# Patient Record
Sex: Female | Born: 1980 | Race: White | Hispanic: No | Marital: Married | Smoking: Former smoker
Health system: Southern US, Community
[De-identification: ages and names within clinical notes are randomized; demographics above are authoritative.]

## PROBLEM LIST (undated history)

## (undated) DIAGNOSIS — R51 Headache: Secondary | ICD-10-CM

## (undated) DIAGNOSIS — R519 Headache, unspecified: Secondary | ICD-10-CM

## (undated) DIAGNOSIS — K219 Gastro-esophageal reflux disease without esophagitis: Secondary | ICD-10-CM

## (undated) DIAGNOSIS — I10 Essential (primary) hypertension: Secondary | ICD-10-CM

## (undated) HISTORY — PX: COLONOSCOPY: SHX174

## (undated) HISTORY — PX: CYST EXCISION: SHX5701

## (undated) HISTORY — PX: CHOLECYSTECTOMY: SHX55

---

## 2004-10-06 ENCOUNTER — Emergency Department: Payer: Self-pay | Admitting: Emergency Medicine

## 2007-09-30 ENCOUNTER — Emergency Department: Payer: Self-pay

## 2008-12-22 ENCOUNTER — Emergency Department: Payer: Self-pay | Admitting: Internal Medicine

## 2010-12-26 ENCOUNTER — Emergency Department: Payer: Self-pay | Admitting: Emergency Medicine

## 2013-07-21 ENCOUNTER — Emergency Department: Payer: Self-pay | Admitting: Emergency Medicine

## 2013-12-29 ENCOUNTER — Emergency Department: Payer: Self-pay | Admitting: Emergency Medicine

## 2015-01-06 ENCOUNTER — Emergency Department: Payer: Self-pay | Admitting: Emergency Medicine

## 2015-05-04 DIAGNOSIS — N978 Female infertility of other origin: Secondary | ICD-10-CM | POA: Insufficient documentation

## 2015-05-04 DIAGNOSIS — Z3181 Encounter for male factor infertility in female patient: Secondary | ICD-10-CM

## 2015-07-27 ENCOUNTER — Encounter: Payer: Self-pay | Admitting: Emergency Medicine

## 2015-07-27 ENCOUNTER — Emergency Department
Admission: EM | Admit: 2015-07-27 | Discharge: 2015-07-27 | Disposition: A | Discharge status: Home / Self Care | Payer: BLUE CROSS/BLUE SHIELD | Attending: Emergency Medicine | Admitting: Emergency Medicine

## 2015-07-27 ENCOUNTER — Emergency Department: Payer: BLUE CROSS/BLUE SHIELD

## 2015-07-27 DIAGNOSIS — M25561 Pain in right knee: Secondary | ICD-10-CM | POA: Diagnosis not present

## 2015-07-27 DIAGNOSIS — Z720 Tobacco use: Secondary | ICD-10-CM | POA: Diagnosis not present

## 2015-07-27 DIAGNOSIS — Z79899 Other long term (current) drug therapy: Secondary | ICD-10-CM | POA: Diagnosis not present

## 2015-07-27 NOTE — ED Notes (Signed)
Reports pain behind right knee.  Ambulates well, minimal swelling noted

## 2015-07-27 NOTE — Discharge Instructions (Signed)
Please seek medical attention for any high fevers, chest pain, shortness of breath, change in behavior, persistent vomiting, bloody stool or any other new or concerning symptoms.  RICE: Routine Care for Injuries The routine care of many injuries includes Rest, Ice, Compression, and Elevation (RICE). HOME CARE INSTRUCTIONS  Rest is needed to allow your body to heal. Routine activities can usually be resumed when comfortable. Injured tendons and bones can take up to 6 weeks to heal. Tendons are the cord-like structures that attach muscle to bone.  Ice following an injury helps keep the swelling down and reduces pain.  Put ice in a plastic bag.  Place a towel between your skin and the bag.  Leave the ice on for 15-20 minutes, 3-4 times a day, or as directed by your health care provider. Do this while awake, for the first 24 to 48 hours. After that, continue as directed by your caregiver.  Compression helps keep swelling down. It also gives support and helps with discomfort. If an elastic bandage has been applied, it should be removed and reapplied every 3 to 4 hours. It should not be applied tightly, but firmly enough to keep swelling down. Watch fingers or toes for swelling, bluish discoloration, coldness, numbness, or excessive pain. If any of these problems occur, remove the bandage and reapply loosely. Contact your caregiver if these problems continue.  Elevation helps reduce swelling and decreases pain. With extremities, such as the arms, hands, legs, and feet, the injured area should be placed near or above the level of the heart, if possible. SEEK IMMEDIATE MEDICAL CARE IF:  You have persistent pain and swelling.  You develop redness, numbness, or unexpected weakness.  Your symptoms are getting worse rather than improving after several days. These symptoms may indicate that further evaluation or further X-rays are needed. Sometimes, X-rays may not show a small broken bone (fracture)  until 1 week or 10 days later. Make a follow-up appointment with your caregiver. Ask when your X-ray results will be ready. Make sure you get your X-ray results. Document Released: 03/04/2001 Document Revised: 11/25/2013 Document Reviewed: 04/21/2011 [facility] Patient Information 2015 [place], [place]. This information is not intended to replace advice given to you by your health care provider. Make sure you discuss any questions you have with your health care provider.

## 2015-07-27 NOTE — ED Provider Notes (Signed)
[facility] Emergency Department Provider Note   ____________________________________________  Time seen: 1825  I have reviewed the triage vital signs and the nursing notes.   HISTORY  Chief Complaint Knee Pain   History limited by: Not Limited   HPI Patricia Gonzalez is a 34 y.o. female who presents to the emergency department today from urgent care because of concerns for right knee pain. The patient states that the knee pain has been present for roughly 1.5 weeks. She denies any obvious trauma, twisting or injury to the knee. She states the pain is located in the back of the knee towards the calf muscle. She states that it is painful at night and it is hard for her to fully extend her right knee. At urgent care they measure were concern for increased diameter of that right knee. Patient denies any fevers.     History reviewed. No pertinent past medical history.  There are no active problems to display for this patient.   History reviewed. No pertinent past surgical history.  Current Outpatient Rx  Name  Route  Sig  Dispense  Refill  . labetalol (NORMODYNE) 300 MG tablet   Oral   Take 300 mg by mouth 2 (two) times daily.         . metFORMIN (GLUCOPHAGE) 500 MG tablet   Oral   Take 500 mg by mouth 2 (two) times daily with a meal.         . omeprazole (PRILOSEC) 40 MG capsule   Oral   Take 40 mg by mouth daily.           Allergies Review of patient's allergies indicates no known allergies.  History reviewed. No pertinent family history.  Social History Social History  Substance Use Topics  . Smoking status: Current Every Day Smoker  . Smokeless tobacco: None  . Alcohol Use: No    Review of Systems  Constitutional: Negative for fever. Cardiovascular: Negative for chest pain. Respiratory: Negative for shortness of breath. Gastrointestinal: Negative for abdominal pain, vomiting and diarrhea. Musculoskeletal: Positive for  right knee pain Skin: Negative for rash. Neurological: Negative for headaches, focal weakness or numbness.   10-point ROS otherwise negative.  ____________________________________________   PHYSICAL EXAM:  VITAL SIGNS: ED Triage Vitals  Enc Vitals Group     BP 07/27/15 1657 148/85 mmHg     Pulse Rate 07/27/15 1657 78     Resp 07/27/15 1657 18     Temp 07/27/15 1657 98 F (36.7 C)     Temp Source 07/27/15 1657 Oral     SpO2 07/27/15 1657 99 %     Weight 07/27/15 1657 225 lb (102.059 kg)     Height 07/27/15 1657 5\' 5"  (1.651 m)     Head Cir --      Peak Flow --      Pain Score 07/27/15 1701 5   Constitutional: Alert and oriented. Well appearing and in no distress. Eyes: Conjunctivae are normal. PERRL. Normal extraocular movements. ENT   Head: Normocephalic and atraumatic.   Nose: No congestion/rhinnorhea.   Mouth/Throat: Mucous membranes are moist.   Neck: No stridor. Hematological/Lymphatic/Immunilogical: No cervical lymphadenopathy. Cardiovascular: Normal rate, regular rhythm.  No murmurs, rubs, or gallops. Respiratory: Normal respiratory effort without tachypnea nor retractions. Breath sounds are clear and equal bilaterally. No wheezes/rales/rhonchi. Musculoskeletal: Slightly tender to palpation of the right posterior knee or. Slightly tender to full passive extension of the right knee. No skin discoloration appreciated. No deformity  appreciated. No knee effusion appreciated. Neurovascularly intact distally. Neurologic:  Normal speech and language. No gross focal neurologic deficits are appreciated. Speech is normal.  Skin:  Skin is warm, dry and intact. No rash noted. Psychiatric: Mood and affect are normal. Speech and behavior are normal. Patient exhibits appropriate insight and judgment.  ____________________________________________    LABS (pertinent  positives/negatives)  None  ____________________________________________   EKG  None  ____________________________________________    RADIOLOGY   US venous Lower unilateral right IMPRESSION: No evidence of deep venous thrombosis.  ____________________________________________   PROCEDURES  Procedure(s) performed: None  Critical Care performed: No  ____________________________________________   INITIAL IMPRESSION / ASSESSMENT AND PLAN / ED COURSE  Pertinent labs & imaging results that were available during my care of the patient were reviewed by me and considered in my medical decision making (see chart for details).  Patient presented with right knee pain for roughly 1.5 weeks. Pain is located in back of the knee. On exam no obvious deformities, effusions, discoloration. Ultrasound did not show any signs of blood clots. At this point doubt osseous injury given no traumatic injury to the knee. Recommended RICE  ____________________________________________   FINAL CLINICAL IMPRESSION(S) / ED DIAGNOSES  Final diagnoses:  Knee pain, right     Nance Pear, MD 07/27/15 0370

## 2016-04-06 ENCOUNTER — Emergency Department: Payer: 59

## 2016-04-06 ENCOUNTER — Emergency Department
Admission: EM | Admit: 2016-04-06 | Discharge: 2016-04-06 | Disposition: A | Discharge status: Home / Self Care | Payer: 59 | Attending: Emergency Medicine | Admitting: Emergency Medicine

## 2016-04-06 ENCOUNTER — Encounter: Payer: Self-pay | Admitting: Emergency Medicine

## 2016-04-06 DIAGNOSIS — Y9389 Activity, other specified: Secondary | ICD-10-CM | POA: Insufficient documentation

## 2016-04-06 DIAGNOSIS — Y929 Unspecified place or not applicable: Secondary | ICD-10-CM | POA: Diagnosis not present

## 2016-04-06 DIAGNOSIS — S99911A Unspecified injury of right ankle, initial encounter: Secondary | ICD-10-CM | POA: Diagnosis present

## 2016-04-06 DIAGNOSIS — Z7984 Long term (current) use of oral hypoglycemic drugs: Secondary | ICD-10-CM | POA: Insufficient documentation

## 2016-04-06 DIAGNOSIS — Y999 Unspecified external cause status: Secondary | ICD-10-CM | POA: Insufficient documentation

## 2016-04-06 DIAGNOSIS — S96911A Strain of unspecified muscle and tendon at ankle and foot level, right foot, initial encounter: Secondary | ICD-10-CM

## 2016-04-06 DIAGNOSIS — S96211A Strain of intrinsic muscle and tendon at ankle and foot level, right foot, initial encounter: Secondary | ICD-10-CM | POA: Insufficient documentation

## 2016-04-06 DIAGNOSIS — Z79899 Other long term (current) drug therapy: Secondary | ICD-10-CM | POA: Insufficient documentation

## 2016-04-06 DIAGNOSIS — F17200 Nicotine dependence, unspecified, uncomplicated: Secondary | ICD-10-CM | POA: Diagnosis not present

## 2016-04-06 DIAGNOSIS — X501XXA Overexertion from prolonged static or awkward postures, initial encounter: Secondary | ICD-10-CM | POA: Insufficient documentation

## 2016-04-06 MED ORDER — HYDROCODONE-ACETAMINOPHEN 5-325 MG PO TABS
2.0000 | ORAL_TABLET | ORAL | Status: AC
  Administered 2016-04-06: 2 via ORAL
  Filled 2016-04-06: qty 2

## 2016-04-06 MED ORDER — HYDROCODONE-ACETAMINOPHEN 5-325 MG PO TABS: 1.0000 | ORAL_TABLET | Freq: Four times a day (QID) | ORAL | Status: DC | PRN

## 2016-04-06 NOTE — Discharge Instructions (Signed)
As we discussed please return to the ER right away if you develop any weakness in the foot, can't walk on the foot, your pain is worsening, you develop any redness, fever, chills, a cold or blue foot or other new concerns arise.  No driving today or while using hydrocodone.  Ankle Sprain An ankle sprain is an injury to the strong, fibrous tissues (ligaments) that hold the bones of your ankle joint together.  CAUSES An ankle sprain is usually caused by a fall or by twisting your ankle. Ankle sprains most commonly occur when you step on the outer edge of your foot, and your ankle turns inward. People who participate in sports are more prone to these types of injuries.  SYMPTOMS   Pain in your ankle. The pain may be present at rest or only when you are trying to stand or walk.  Swelling.  Bruising. Bruising may develop immediately or within 1 to 2 days after your injury.  Difficulty standing or walking, particularly when turning corners or changing directions. DIAGNOSIS  Your caregiver will ask you details about your injury and perform a physical exam of your ankle to determine if you have an ankle sprain. During the physical exam, your caregiver will press on and apply pressure to specific areas of your foot and ankle. Your caregiver will try to move your ankle in certain ways. An X-ray exam may be done to be sure a bone was not broken or a ligament did not separate from one of the bones in your ankle (avulsion fracture).  TREATMENT  Certain types of braces can help stabilize your ankle. Your caregiver can make a recommendation for this. Your caregiver may recommend the use of medicine for pain. If your sprain is severe, your caregiver may refer you to a surgeon who helps to restore function to parts of your skeletal system (orthopedist) or a physical therapist. [place] ice to your injury for 1-2 days or as directed by your caregiver. Applying ice helps to reduce  inflammation and pain.  Put ice in a plastic bag.  Place a towel between your skin and the bag.  Leave the ice on for 15-20 minutes at a time, every 2 hours while you are awake.  Only take over-the-counter or prescription medicines for pain, discomfort, or fever as directed by your caregiver.  Elevate your injured ankle above the level of your heart as much as possible for 2-3 days.  If your caregiver recommends crutches, use them as instructed. Gradually put weight on the affected ankle. Continue to use crutches or a cane until you can walk without feeling pain in your ankle.  If you have a plaster splint, wear the splint as directed by your caregiver. Do not rest it on anything harder than a pillow for the first 24 hours. Do not put weight on it. Do not get it wet. You may take it off to take a shower or bath.  You may have been given an elastic bandage to wear around your ankle to provide support. If the elastic bandage is too tight (you have numbness or tingling in your foot or your foot becomes cold and blue), adjust the bandage to make it comfortable.  If you have an air splint, you may blow more air into it or let air out to make it more comfortable. You may take your splint off at night and before taking a shower or bath. Wiggle your toes in the splint several  times per day to decrease swelling. SEEK MEDICAL CARE IF:   You have rapidly increasing bruising or swelling.  Your toes feel extremely cold or you lose feeling in your foot.  Your pain is not relieved with medicine. SEEK IMMEDIATE MEDICAL CARE IF:  Your toes are numb or blue.  You have severe pain that is increasing. MAKE SURE YOU:   Understand these instructions.  Will watch your condition.  Will get help right away if you are not doing well or get worse.   This information is not intended to replace advice given to you by your health care provider. Make sure you discuss any questions you have with your health  care provider.   Document Released: 11/20/2005 Document Revised: 12/11/2014 Document Reviewed: 12/02/2011 Elsevier Interactive Patient Education [employer].

## 2016-04-06 NOTE — ED Provider Notes (Signed)
[facility] Emergency Department Provider Note  ____________________________________________  Time seen: Approximately 9:01 PM  I have reviewed the triage vital signs and the nursing notes.   HISTORY  Chief Complaint Ankle Pain and Leg Swelling    HPI Patricia Gonzalez is a 35 y.o. female presents for evaluation of right ankle pain and swelling. Patient reports that for the last few days she's been noticing some tenderness around her right ankle, that she has a bad right ankle and occasionally infrequently has sprained it.  She works on her feet all day, reports she last few days that she's had a lot of discomfort around the right ankle, trouble standing and walking. No fevers redness or chills. She does note however that the right ankle seems slightly swollen. Some pain shoots up into the back of the right calf when she tries to walk.  No numbness or tingling or weakness, but states that pain standing on the ankle causes pain. No pain in the ankle or leg. No pain in the foot. She is not aware of any clear injury, but reports that she feels like sometimes she can strain her ankle very easily and standing out at work seems to make things worse similar to this.  History reviewed. No pertinent past medical history.  There are no active problems to display for this patient.   Past Surgical History  Procedure Laterality Date  . Cholecystectomy      Current Outpatient Rx  Name  Route  Sig  Dispense  Refill  . HYDROcodone-acetaminophen (NORCO/VICODIN) 5-325 MG tablet   Oral   Take 1-2 tablets by mouth every 6 (six) hours as needed for moderate pain.   20 tablet   0   . labetalol (NORMODYNE) 300 MG tablet   Oral   Take 300 mg by mouth 2 (two) times daily.         . metFORMIN (GLUCOPHAGE) 500 MG tablet   Oral   Take 500 mg by mouth 2 (two) times daily with a meal.         . omeprazole (PRILOSEC) 40 MG capsule   Oral   Take 40 mg by mouth daily.            Allergies Review of patient's allergies indicates no known allergies.  No family history on file.  Social History Social History  Substance Use Topics  . Smoking status: Current Every Day Smoker  . Smokeless tobacco: None  . Alcohol Use: No    Review of Systems Constitutional: No fever/chills Eyes: No visual changes. ENT: No sore throat. Cardiovascular: Denies chest pain. Respiratory: Denies shortness of breath. Gastrointestinal: No abdominal pain.  No nausea, no vomiting.  No diarrhea.  No constipation. Genitourinary: Negative for dysuria. Musculoskeletal: Negative for back pain. Skin: Negative for rash. Neurological: Negative for headaches, focal weakness or numbness.  10-point ROS otherwise negative.  ____________________________________________   PHYSICAL EXAM:  VITAL SIGNS: ED Triage Vitals  Enc Vitals Group     BP 04/06/16 2035 178/97 mmHg     Pulse Rate 04/06/16 2035 67     Resp 04/06/16 2035 20     Temp 04/06/16 2035 97.8 F (36.6 C)     Temp Source 04/06/16 2035 Oral     SpO2 04/06/16 2035 100 %     Weight 04/06/16 2035 220 lb (99.791 kg)     Height 04/06/16 2035 5\' 5"  (1.651 m)     Head Cir --      Peak  Flow --      Pain Score 04/06/16 2034 8     Pain Loc --      Pain Edu? --      Excl. in [place]? --    Constitutional: Alert and oriented. Well appearing and in no acute distress. Eyes: Conjunctivae are normal. PERRL. EOMI. Head: Atraumatic. Nose: No congestion/rhinnorhea. Mouth/Throat: Mucous membranes are moist.  Oropharynx non-erythematous. Neck: No stridor. Cardiovascular: Normal rate, regular rhythm. Grossly normal heart sounds.  Good peripheral circulation. Respiratory: Normal respiratory effort.  No retractions. Lungs CTAB. Gastrointestinal: Soft and nontender. No distention. Musculoskeletal:   Lower Extremities  No edema. Normal DP/PT pulses bilateral with good cap refill.  Normal neuro-motor function lower extremities  bilateral.  RIGHT Right lower extremity demonstrates normal strength, good use of all muscles. No edema bruising or contusions of the right hip, right knee. Full range of motion of the right lower extremity without pain except for some moderate tenderness to range of motion of the right ankle. There is noted tenderness across the right lateral ankle without frank effusion. There is minimal edema extending from the ankle across the anterior tibial fibular ligament. No pain on axial loading. No evidence of trauma or deformity. Palpable posterior tibial and dorsalis pedis pulses with normal capillary refill to the foot. There is no overlying erythema or frank joint effusion.  LEFT Left lower extremity demonstrates normal strength, good use of all muscles. No edema bruising or contusions of the hip,  knee, ankle. Full range of motion of the left lower extremity without pain. No pain on axial loading. No evidence of trauma.   Neurologic:  Normal speech and language. No gross focal neurologic deficits are appreciated. No gait instability. Skin:  Skin is warm, dry and intact. No rash noted. Psychiatric: Mood and affect are normal. Speech and behavior are normal.  ____________________________________________   LABS (all labs ordered are listed, but only abnormal results are displayed)  Labs Reviewed - No data to display ____________________________________________  EKG   ____________________________________________  RADIOLOGY   US Venous Img Lower Unilateral Right (Final result) Result time: 04/06/16 21:47:45   Final result by Rad Results In Interface (04/06/16 21:47:45)   Narrative:   CLINICAL DATA: Right leg pain for 3 days.  EXAM: Right LOWER EXTREMITY VENOUS DOPPLER ULTRASOUND  TECHNIQUE: Gray-scale sonography with graded compression, as well as color Doppler and duplex ultrasound were performed to evaluate the lower extremity deep venous systems from the level of the common  femoral vein and including the common femoral, femoral, profunda femoral, popliteal and calf veins including the posterior tibial, peroneal and gastrocnemius veins when visible. The superficial great saphenous vein was also interrogated. Spectral Doppler was utilized to evaluate flow at rest and with distal augmentation maneuvers in the common femoral, femoral and popliteal veins.  COMPARISON: None.  FINDINGS: Contralateral Common Femoral Vein: Respiratory phasicity is normal and symmetric with the symptomatic side. No evidence of thrombus. Normal compressibility.  Common Femoral Vein: No evidence of thrombus. Normal compressibility, respiratory phasicity and response to augmentation.  Saphenofemoral Junction: No evidence of thrombus. Normal compressibility and flow on color Doppler imaging.  Profunda Femoral Vein: No evidence of thrombus. Normal compressibility and flow on color Doppler imaging.  Femoral Vein: No evidence of thrombus. Normal compressibility, respiratory phasicity and response to augmentation.  Popliteal Vein: No evidence of thrombus. Normal compressibility, respiratory phasicity and response to augmentation.  Calf Veins: No evidence of thrombus. Normal compressibility and flow on color Doppler imaging.  Superficial Great  Saphenous Vein: No evidence of thrombus. Normal compressibility and flow on color Doppler imaging.  Venous Reflux: None.  Other Findings: None.  IMPRESSION: No evidence of deep venous thrombosis.   Electronically Signed By: Lucienne Capers M.D. On: 04/06/2016 21:47          DG Ankle Complete Right (Final result) Result time: 04/06/16 21:18:05   Final result by Rad Results In Interface (04/06/16 21:18:05)   Narrative:   CLINICAL DATA: Right lower leg edema.  EXAM: RIGHT ANKLE - COMPLETE 3+ VIEW  COMPARISON: None.  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 Small plantar calcaneal  spur. No evidence of acute fracture or subluxation. No focal bone lesion or bone destruction. Bone cortex and trabecular architecture appear intact. No radiopaque soft tissue foreign bodies.  IMPRESSION: No acute bony abnormalities.   Electronically Signed By: Lucienne Capers M.D. On: 04/06/2016 21:18    ____________________________________________   PROCEDURES  Procedure(s) performed: None  Critical Care performed: No  ____________________________________________   INITIAL IMPRESSION / ASSESSMENT AND PLAN / ED COURSE  Pertinent labs & imaging results that were available during my care of the patient were reviewed by me and considered in my medical decision making (see chart for details).  History evaluation of right ankle pain. No evidence of acute neurologic or vascular deficit. Given pain and slight edema, ultrasound was performed which does not demonstrate DVT. No clear evidence of trauma, however x-ray does not demonstrate injury. She has no fever, no redness or erythema to suggest acute inflammatory or infectious process. No significant risk factors for septic joint.  Discussed with the patient, after receiving hydrocodone she feels improved. Patient offered cane/crutches, however she states that she asked he has a walker and cane at home which she's had these before. She will use this. Did discuss with the patient, and she states that her sister will be picking her up and giving her a ride home as we are giving her pain medication here. Patient is agreeable to this.  I will prescribe the patient a narcotic pain medicine due to their condition which I anticipate will cause at least moderate pain short term. I discussed with the patient safe use of narcotic pain medicines, and that they are not to drive, work in dangerous areas, or ever take more than prescribed (no more than 1 pill every 6 hours). We discussed that this is the type of medication that can be  overdosed on  and the risks of this type of medicine. Patient is very agreeable to only use as prescribed and to never use more than prescribed.  Return precautions and treatment recommendations and follow-up discussed with the patient who is agreeable with the plan.  I discussed with the patient specifically if she develops a fever, increasing pain or swelling, cool, numb, or weakness, redness, chills, or any signs of infection that she should return to ER right away. ____________________________________________   FINAL CLINICAL IMPRESSION(S) / ED DIAGNOSES  Final diagnoses:  Right ankle strain, initial encounter      Delman Kitten, MD 04/06/16 2236

## 2016-04-06 NOTE — ED Notes (Signed)
Pt states her sister will be available to take her home, or she will go upstairs with her husband in room 17 if he is admitted.

## 2016-04-06 NOTE — ED Notes (Addendum)
Patient ambulatory to triage with steady gait, without difficulty or distress noted; pt reports pain began to right ankle on Monday with no known injury, "shooting up back of leg" and some swelling noted--pt indicates calf and behind knee; denies [facility] or CP

## 2016-05-09 ENCOUNTER — Other Ambulatory Visit: Payer: Self-pay | Admitting: Specialist

## 2016-05-09 DIAGNOSIS — I10 Essential (primary) hypertension: Secondary | ICD-10-CM | POA: Insufficient documentation

## 2016-05-09 DIAGNOSIS — E119 Type 2 diabetes mellitus without complications: Secondary | ICD-10-CM | POA: Insufficient documentation

## 2016-05-11 ENCOUNTER — Ambulatory Visit
Admission: RE | Admit: 2016-05-11 | Discharge: 2016-05-11 | Disposition: A | Discharge status: Home / Self Care | Payer: 59 | Source: Ambulatory Visit | Attending: Specialist | Admitting: Specialist

## 2016-07-11 DIAGNOSIS — G4733 Obstructive sleep apnea (adult) (pediatric): Secondary | ICD-10-CM | POA: Insufficient documentation

## 2016-12-26 ENCOUNTER — Encounter
Admission: RE | Admit: 2016-12-26 | Discharge: 2016-12-26 | Disposition: A | Discharge status: Home / Self Care | Payer: 59 | Source: Ambulatory Visit | Attending: Specialist | Admitting: Specialist

## 2016-12-26 DIAGNOSIS — Z01812 Encounter for preprocedural laboratory examination: Secondary | ICD-10-CM | POA: Insufficient documentation

## 2016-12-26 HISTORY — DX: Headache: R51

## 2016-12-26 HISTORY — DX: Headache, unspecified: R51.9

## 2016-12-26 HISTORY — DX: Essential (primary) hypertension: I10

## 2016-12-26 HISTORY — DX: Gastro-esophageal reflux disease without esophagitis: K21.9

## 2016-12-26 LAB — TYPE AND SCREEN
ABO/RH(D): O POS
Antibody Screen: NEGATIVE

## 2016-12-26 LAB — BASIC METABOLIC PANEL
Anion gap: 10 (ref 5–15)
BUN: 16 mg/dL (ref 6–20)
CO2: 22 mmol/L (ref 22–32)
Calcium: 9.2 mg/dL (ref 8.9–10.3)
Chloride: 104 mmol/L (ref 101–111)
Creatinine, Ser: 0.56 mg/dL (ref 0.44–1.00)
GFR calc Af Amer: >60 mL/min (ref >60)
GFR calc non Af Amer: >60 mL/min (ref >60)
GLUCOSE: 90 mg/dL (ref 65–99)
POTASSIUM: 3.6 mmol/L (ref 3.5–5.1)
Sodium: 136 mmol/L (ref 135–145)

## 2016-12-26 NOTE — Patient Instructions (Signed)
  Your procedure is scheduled on: 01-02-17 (TUESDAY) Report to Same Day Surgery 2nd floor medical mall [facility] Entrance-take elevator on left to 2nd floor.  Check in with surgery information desk.) To find out your arrival time please call [phone] between 1PM - 3PM on 01-01-17 [facility])  Remember: Instructions that are not followed completely may result in serious medical risk, up to and including death, or upon the discretion of your surgeon and anesthesiologist your surgery may need to be rescheduled.    _x___ 1. Do not eat food or drink liquids after midnight. No gum chewing or hard candies.     __x__ 2. No Alcohol for 24 hours before or after surgery.   __x__3. No Smoking for 24 prior to surgery.   ____  4. Bring all medications with you on the day of surgery if instructed.    __x__ 5. Notify your doctor if there is any change in your medical condition     (cold, fever, infections).     Do not wear jewelry, make-up, hairpins, clips or nail polish.  Do not wear lotions, powders, or perfumes. You may wear deodorant.  Do not shave 48 hours prior to surgery. Men may shave face and neck.  Do not bring valuables to the hospital.    [facility] is not responsible for any belongings or valuables.               Contacts, dentures or bridgework may not be worn into surgery.  Leave your suitcase in the car. After surgery it may be brought to your room.  For patients admitted to the hospital, discharge time is determined by your treatment team.   Patients discharged the day of surgery will not be allowed to drive home.  You will need someone to drive you home and stay with you the night of your procedure.    Please read over the following fact sheets that you were given:   [facility] Preparing for Surgery and or MRSA Information   _x___ Take these medicines the morning of surgery with A SIP OF WATER:    1. ATENOLOL  2. PROPRANOLOL  3. VERAPAMIL  4.  5.  6.  ____Fleets  enema or Magnesium Citrate as directed.   _x___ Use CHG Soap or sage wipes as directed on instruction sheet   ____ Use inhalers on the day of surgery and bring to hospital day of surgery  _X___ Stop metformin 2 days prior to surgery-LAST DOSE ON Saturday, January 27TH    ____ Take 1/2 of usual insulin dose the night before surgery and none on the morning of surgery.   ____ Stop Aspirin, Coumadin, Pllavix ,Eliquis, Effient, or Pradaxa  x__ Stop Anti-inflammatories such as Advil, Aleve, Ibuprofen, Motrin, Naproxen,          Naprosyn, Goodies powders or aspirin products NOW-Ok to take Tylenol.   ____ Stop supplements until after surgery.    ____ Bring C-Pap to the hospital.

## 2016-12-27 ENCOUNTER — Inpatient Hospital Stay: Admission: RE | Admit: 2016-12-27 | Payer: 59 | Source: Ambulatory Visit

## 2017-01-02 ENCOUNTER — Encounter
Admission: RE | Disposition: A | Discharge status: Home / Self Care | Payer: Self-pay | Source: Ambulatory Visit | Attending: Specialist

## 2017-01-02 ENCOUNTER — Inpatient Hospital Stay: Payer: 59 | Admitting: Certified Registered Nurse Anesthetist

## 2017-01-02 ENCOUNTER — Inpatient Hospital Stay
Admission: RE | Admit: 2017-01-02 | Discharge: 2017-01-03 | DRG: 621 | Disposition: A | Discharge status: Home / Self Care | Payer: 59 | Source: Ambulatory Visit | Attending: Specialist | Admitting: Specialist

## 2017-01-02 DIAGNOSIS — F17210 Nicotine dependence, cigarettes, uncomplicated: Secondary | ICD-10-CM | POA: Diagnosis present

## 2017-01-02 DIAGNOSIS — Z888 Allergy status to other drugs, medicaments and biological substances status: Secondary | ICD-10-CM

## 2017-01-02 DIAGNOSIS — I10 Essential (primary) hypertension: Secondary | ICD-10-CM | POA: Diagnosis present

## 2017-01-02 DIAGNOSIS — G4733 Obstructive sleep apnea (adult) (pediatric): Secondary | ICD-10-CM | POA: Diagnosis present

## 2017-01-02 DIAGNOSIS — Z885 Allergy status to narcotic agent status: Secondary | ICD-10-CM | POA: Diagnosis not present

## 2017-01-02 DIAGNOSIS — E119 Type 2 diabetes mellitus without complications: Secondary | ICD-10-CM | POA: Diagnosis present

## 2017-01-02 DIAGNOSIS — Z7984 Long term (current) use of oral hypoglycemic drugs: Secondary | ICD-10-CM

## 2017-01-02 DIAGNOSIS — Z79899 Other long term (current) drug therapy: Secondary | ICD-10-CM | POA: Diagnosis not present

## 2017-01-02 DIAGNOSIS — K449 Diaphragmatic hernia without obstruction or gangrene: Secondary | ICD-10-CM | POA: Diagnosis present

## 2017-01-02 DIAGNOSIS — Z8249 Family history of ischemic heart disease and other diseases of the circulatory system: Secondary | ICD-10-CM

## 2017-01-02 DIAGNOSIS — Z833 Family history of diabetes mellitus: Secondary | ICD-10-CM

## 2017-01-02 DIAGNOSIS — Z6839 Body mass index (BMI) 39.0-39.9, adult: Secondary | ICD-10-CM

## 2017-01-02 HISTORY — PX: LAPAROSCOPIC GASTRIC SLEEVE RESECTION WITH HIATAL HERNIA REPAIR: SHX6512

## 2017-01-02 LAB — GLUCOSE, CAPILLARY
Glucose-Capillary: 119 mg/dL — ABNORMAL HIGH (ref 65–99)
Glucose-Capillary: 155 mg/dL — ABNORMAL HIGH (ref 65–99)

## 2017-01-02 LAB — HEMOGLOBIN AND HEMATOCRIT, BLOOD
HCT: 36 % (ref 35.0–47.0)
HEMOGLOBIN: 12.6 g/dL (ref 12.0–16.0)

## 2017-01-02 LAB — POCT PREGNANCY, URINE: PREG TEST UR: NEGATIVE

## 2017-01-02 LAB — ABO/RH: ABO/RH(D): O POS

## 2017-01-02 SURGERY — GASTRECTOMY, SLEEVE, LAPAROSCOPIC, WITH HIATAL HERNIA REPAIR
Anesthesia: General | Wound class: Clean Contaminated

## 2017-01-02 MED ORDER — FENTANYL CITRATE (PF) 250 MCG/5ML IJ SOLN
INTRAMUSCULAR | Status: AC
  Filled 2017-01-02: qty 5

## 2017-01-02 MED ORDER — HYDROMORPHONE HCL 1 MG/ML IJ SOLN
2.0000 mg | INTRAMUSCULAR | Status: DC | PRN
  Administered 2017-01-02 – 2017-01-03 (×6): 2 mg via INTRAVENOUS
  Filled 2017-01-02 (×6): qty 2
  Filled 2017-01-02: qty 1

## 2017-01-02 MED ORDER — FENTANYL CITRATE (PF) 100 MCG/2ML IJ SOLN
INTRAMUSCULAR | Status: AC
  Filled 2017-01-02: qty 2

## 2017-01-02 MED ORDER — SUCCINYLCHOLINE CHLORIDE 20 MG/ML IJ SOLN
INTRAMUSCULAR | Status: DC | PRN
  Administered 2017-01-02: 120 mg via INTRAVENOUS

## 2017-01-02 MED ORDER — EPHEDRINE SULFATE 50 MG/ML IJ SOLN
INTRAMUSCULAR | Status: DC | PRN
  Administered 2017-01-02: 10 mg via INTRAVENOUS

## 2017-01-02 MED ORDER — PROMETHAZINE HCL 25 MG/ML IJ SOLN: 6.2500 mg | INTRAMUSCULAR | Status: DC | PRN

## 2017-01-02 MED ORDER — BUPIVACAINE HCL (PF) 0.5 % IJ SOLN
INTRAMUSCULAR | Status: AC
  Filled 2017-01-02: qty 30

## 2017-01-02 MED ORDER — CEFOXITIN SODIUM-DEXTROSE 2-2.2 GM-% IV SOLR (PREMIX)
2.0000 g | Freq: Once | INTRAVENOUS | Status: AC
  Administered 2017-01-02: 2000 mg via INTRAVENOUS

## 2017-01-02 MED ORDER — FENTANYL CITRATE (PF) 100 MCG/2ML IJ SOLN
50.0000 ug | Freq: Once | INTRAMUSCULAR | Status: AC
  Administered 2017-01-02: 50 ug via INTRAVENOUS

## 2017-01-02 MED ORDER — ONDANSETRON HCL 4 MG/2ML IJ SOLN
4.0000 mg | INTRAMUSCULAR | Status: DC | PRN
Start: 2017-01-02 — End: 2017-01-03
  Administered 2017-01-02: 4 mg via INTRAVENOUS
  Filled 2017-01-02: qty 2

## 2017-01-02 MED ORDER — PROPOFOL 10 MG/ML IV BOLUS
INTRAVENOUS | Status: AC
Start: 2017-01-02 — End: 2017-01-02
  Filled 2017-01-02: qty 20

## 2017-01-02 MED ORDER — PROMETHAZINE HCL 25 MG/ML IJ SOLN: 12.5000 mg | INTRAMUSCULAR | Status: DC | PRN

## 2017-01-02 MED ORDER — HYDROCODONE-ACETAMINOPHEN 7.5-325 MG/15ML PO SOLN
15.0000 mL | Freq: Four times a day (QID) | ORAL | 0 refills | Status: AC | PRN
Start: 2017-01-02 — End: 2017-01-09

## 2017-01-02 MED ORDER — HYDROMORPHONE HCL 1 MG/ML IJ SOLN
0.5000 mg | INTRAMUSCULAR | Status: AC | PRN
  Administered 2017-01-02 (×4): 0.5 mg via INTRAVENOUS

## 2017-01-02 MED ORDER — MIDAZOLAM HCL 2 MG/2ML IJ SOLN
INTRAMUSCULAR | Status: AC
  Filled 2017-01-02: qty 2

## 2017-01-02 MED ORDER — DEXMEDETOMIDINE HCL IN NACL 200 MCG/50ML IV SOLN
INTRAVENOUS | Status: DC | PRN
  Administered 2017-01-02: 8 ug via INTRAVENOUS

## 2017-01-02 MED ORDER — ROCURONIUM 10MG/ML (10ML) SYRINGE FOR MEDFUSION PUMP - OPTIME: INTRAVENOUS | Status: DC | PRN

## 2017-01-02 MED ORDER — PROPOFOL 10 MG/ML IV BOLUS
INTRAVENOUS | Status: DC | PRN
  Administered 2017-01-02: 200 ug via INTRAVENOUS

## 2017-01-02 MED ORDER — PANTOPRAZOLE SODIUM 40 MG IV SOLR
40.0000 mg | Freq: Every day | INTRAVENOUS | Status: DC
  Administered 2017-01-02: 40 mg via INTRAVENOUS
  Filled 2017-01-02: qty 40

## 2017-01-02 MED ORDER — ONDANSETRON 4 MG PO TBDP: 4.0000 mg | ORAL_TABLET | ORAL | 0 refills | Status: DC | PRN

## 2017-01-02 MED ORDER — OXYCODONE HCL 5 MG/5ML PO SOLN
5.0000 mg | ORAL | Status: DC | PRN
  Administered 2017-01-03 (×3): 10 mg via ORAL
  Filled 2017-01-02 (×3): qty 10

## 2017-01-02 MED ORDER — ACETAMINOPHEN 10 MG/ML IV SOLN
1000.0000 mg | Freq: Once | INTRAVENOUS | Status: AC
  Administered 2017-01-02: 1000 mg via INTRAVENOUS

## 2017-01-02 MED ORDER — PHENYLEPHRINE HCL 10 MG/ML IJ SOLN
INTRAMUSCULAR | Status: DC | PRN
  Administered 2017-01-02: 200 ug via INTRAVENOUS
  Administered 2017-01-02: 100 ug via INTRAVENOUS
  Administered 2017-01-02: 200 ug via INTRAVENOUS
  Administered 2017-01-02: 100 ug via INTRAVENOUS

## 2017-01-02 MED ORDER — ATENOLOL 25 MG PO TABS: 100.0000 mg | ORAL_TABLET | ORAL | Status: DC

## 2017-01-02 MED ORDER — SCOPOLAMINE 1 MG/3DAYS TD PT72
MEDICATED_PATCH | TRANSDERMAL | Status: AC
  Administered 2017-01-02: 1.5 mg via TRANSDERMAL
  Filled 2017-01-02: qty 1

## 2017-01-02 MED ORDER — CEFOXITIN SODIUM-DEXTROSE 2-2.2 GM-% IV SOLR (PREMIX)
INTRAVENOUS | Status: AC
  Administered 2017-01-02: 2000 mg via INTRAVENOUS
  Filled 2017-01-02: qty 50

## 2017-01-02 MED ORDER — LABETALOL HCL 100 MG PO TABS: 300.0000 mg | ORAL_TABLET | Freq: Two times a day (BID) | ORAL | Status: DC

## 2017-01-02 MED ORDER — FENTANYL CITRATE (PF) 250 MCG/5ML IJ SOLN
INTRAMUSCULAR | Status: DC | PRN
  Administered 2017-01-02: 50 ug via INTRAVENOUS
  Administered 2017-01-02: 150 ug via INTRAVENOUS
  Administered 2017-01-02: 50 ug via INTRAVENOUS

## 2017-01-02 MED ORDER — HYDROMORPHONE BOLUS VIA INFUSION: 2.0000 mg | INTRAVENOUS | Status: DC | PRN

## 2017-01-02 MED ORDER — ENOXAPARIN SODIUM 40 MG/0.4ML ~~LOC~~ SOLN: 40.0000 mg | SUBCUTANEOUS | 0 refills | Status: DC

## 2017-01-02 MED ORDER — ACETAMINOPHEN 10 MG/ML IV SOLN
INTRAVENOUS | Status: AC
  Filled 2017-01-02: qty 100

## 2017-01-02 MED ORDER — ACETAMINOPHEN 160 MG/5ML PO SOLN
325.0000 mg | ORAL | Status: DC | PRN
  Filled 2017-01-02: qty 20.3

## 2017-01-02 MED ORDER — ROCURONIUM BROMIDE 100 MG/10ML IV SOLN
INTRAVENOUS | Status: DC | PRN
  Administered 2017-01-02: 10 mg via INTRAVENOUS
  Administered 2017-01-02: 30 mg via INTRAVENOUS
  Administered 2017-01-02: 20 mg via INTRAVENOUS

## 2017-01-02 MED ORDER — SCOPOLAMINE 1 MG/3DAYS TD PT72
1.0000 | MEDICATED_PATCH | Freq: Once | TRANSDERMAL | Status: DC
  Administered 2017-01-02: 1.5 mg via TRANSDERMAL

## 2017-01-02 MED ORDER — HYDROMORPHONE HCL 1 MG/ML IJ SOLN
INTRAMUSCULAR | Status: AC
  Filled 2017-01-02: qty 1

## 2017-01-02 MED ORDER — SUGAMMADEX SODIUM 200 MG/2ML IV SOLN
INTRAVENOUS | Status: DC | PRN
  Administered 2017-01-02: 200 mg via INTRAVENOUS

## 2017-01-02 MED ORDER — FENTANYL CITRATE (PF) 100 MCG/2ML IJ SOLN
25.0000 ug | INTRAMUSCULAR | Status: DC | PRN
  Administered 2017-01-02 (×3): 50 ug via INTRAVENOUS

## 2017-01-02 MED ORDER — ROCURONIUM BROMIDE 50 MG/5ML IV SOSY
PREFILLED_SYRINGE | INTRAVENOUS | Status: AC
  Filled 2017-01-02: qty 5

## 2017-01-02 MED ORDER — PROPRANOLOL HCL ER 60 MG PO CP24
60.0000 mg | ORAL_CAPSULE | ORAL | Status: DC
  Filled 2017-01-02: qty 1

## 2017-01-02 MED ORDER — ENOXAPARIN SODIUM 40 MG/0.4ML ~~LOC~~ SOLN
40.0000 mg | Freq: Two times a day (BID) | SUBCUTANEOUS | Status: DC
  Administered 2017-01-03: 40 mg via SUBCUTANEOUS
  Filled 2017-01-02: qty 0.4

## 2017-01-02 MED ORDER — VERAPAMIL HCL ER 180 MG PO CP24: 180.0000 mg | ORAL_CAPSULE | ORAL | Status: DC

## 2017-01-02 MED ORDER — BUPIVACAINE-EPINEPHRINE (PF) 0.5% -1:200000 IJ SOLN
INTRAMUSCULAR | Status: DC | PRN
  Administered 2017-01-02: 30 mL

## 2017-01-02 MED ORDER — EPINEPHRINE PF 1 MG/ML IJ SOLN
INTRAMUSCULAR | Status: AC
Start: 2017-01-02 — End: 2017-01-02
  Filled 2017-01-02: qty 1

## 2017-01-02 MED ORDER — MIDAZOLAM HCL 2 MG/2ML IJ SOLN
INTRAMUSCULAR | Status: DC | PRN
  Administered 2017-01-02: 2 mg via INTRAVENOUS

## 2017-01-02 MED ORDER — SODIUM CHLORIDE 0.9 % IV SOLN
INTRAVENOUS | Status: DC
  Administered 2017-01-02: 1000 mL via INTRAVENOUS
  Administered 2017-01-02 (×2): via INTRAVENOUS

## 2017-01-02 MED ORDER — ONDANSETRON HCL 4 MG/2ML IJ SOLN
INTRAMUSCULAR | Status: DC | PRN
  Administered 2017-01-02: 4 mg via INTRAVENOUS

## 2017-01-02 MED ORDER — DEXAMETHASONE SODIUM PHOSPHATE 10 MG/ML IJ SOLN
INTRAMUSCULAR | Status: DC | PRN
  Administered 2017-01-02: 8 mg via INTRAVENOUS

## 2017-01-02 MED ORDER — ACETAMINOPHEN 160 MG/5ML PO SOLN
650.0000 mg | ORAL | Status: DC | PRN
  Filled 2017-01-02: qty 20.3

## 2017-01-02 MED ORDER — DEXTROSE 5 % IV SOLN
2.0000 g | Freq: Three times a day (TID) | INTRAVENOUS | Status: AC
  Administered 2017-01-02 (×2): 2 g via INTRAVENOUS
  Filled 2017-01-02 (×2): qty 2

## 2017-01-02 SURGICAL SUPPLY — 49 items
APPLIER CLIP ROT 13.4 12 LRG (CLIP)
BANDAGE ELASTIC 6 LF NS (GAUZE/BANDAGES/DRESSINGS) ×8 IMPLANT
BLADE SURG SZ11 CARB STEEL (BLADE) ×4 IMPLANT
CANISTER SUCT 1200ML W/VALVE (MISCELLANEOUS) ×4 IMPLANT
CHLORAPREP W/TINT 26ML (MISCELLANEOUS) ×8 IMPLANT
CLIP APPLIE ROT 13.4 12 LRG (CLIP) IMPLANT
DECANTER SPIKE VIAL GLASS SM (MISCELLANEOUS) ×4 IMPLANT
DEFOGGER SCOPE WARMER CLEARIFY (MISCELLANEOUS) ×4 IMPLANT
DERMABOND ADVANCED (GAUZE/BANDAGES/DRESSINGS) ×2
DERMABOND ADVANCED .7 DNX12 (GAUZE/BANDAGES/DRESSINGS) ×2 IMPLANT
DRAPE UTILITY 15X26 TOWEL STRL (DRAPES) ×8 IMPLANT
FILTER LAP SMOKE EVAC STRL (MISCELLANEOUS) ×4 IMPLANT
GLOVE BIO SURGEON STRL SZ8 (GLOVE) ×20 IMPLANT
GOWN STRL REUS W/ TWL LRG LVL3 (GOWN DISPOSABLE) ×4 IMPLANT
GOWN STRL REUS W/ TWL XL LVL3 (GOWN DISPOSABLE) ×2 IMPLANT
GOWN STRL REUS W/TWL LRG LVL3 (GOWN DISPOSABLE) ×4
GOWN STRL REUS W/TWL XL LVL3 (GOWN DISPOSABLE) ×2
GRASPER SUT TROCAR 14GX15 (MISCELLANEOUS) ×4 IMPLANT
IRRIGATION STRYKERFLOW (MISCELLANEOUS) IMPLANT
IRRIGATOR STRYKERFLOW (MISCELLANEOUS)
IV NS 1000ML (IV SOLUTION) ×2
IV NS 1000ML BAXH (IV SOLUTION) ×2 IMPLANT
KIT RM TURNOVER STRD PROC AR (KITS) ×4 IMPLANT
LABEL OR SOLS (LABEL) ×4 IMPLANT
NDL INSUFF 14G 150MM VS150000 (NEEDLE) ×4 IMPLANT
NDL SAFETY 22GX1.5 (NEEDLE) ×4 IMPLANT
NS IRRIG 500ML POUR BTL (IV SOLUTION) ×4 IMPLANT
PACK LAP CHOLECYSTECTOMY (MISCELLANEOUS) ×4 IMPLANT
RELOAD GREEN (STAPLE) ×4 IMPLANT
RELOAD STAPLER GOLD 60MM (STAPLE) ×10 IMPLANT
SET SUCTION IRRIG HYDROSURG (IRRIGATION / IRRIGATOR) ×4 IMPLANT
SHEARS HARMONIC ACE PLUS 45CM (MISCELLANEOUS) ×4 IMPLANT
SLEEVE ENDOPATH XCEL 5M (ENDOMECHANICALS) ×8 IMPLANT
SLEEVE GASTRECTOMY 36FR VISIGI (MISCELLANEOUS) ×4 IMPLANT
STAPLER ECHELON BIOABSB 60 FLE (MISCELLANEOUS) ×20 IMPLANT
STAPLER ECHELON LONG 60 440 (INSTRUMENTS) ×4 IMPLANT
STAPLER RELOAD GOLD 60MM (STAPLE) ×20
SUT DEVICE BRAIDED 0X39 (SUTURE) ×4 IMPLANT
SUT VIC AB 0 CT2 27 (SUTURE) ×4 IMPLANT
SUT VIC AB 3-0 SH 27 (SUTURE) ×2
SUT VIC AB 3-0 SH 27X BRD (SUTURE) ×2 IMPLANT
SUT VIC AB 4-0 PS2 18 (SUTURE) ×8 IMPLANT
TROCAR BLADELESS 15MM (ENDOMECHANICALS) ×4 IMPLANT
TROCAR SL VERSASTEP 5M LG  B (MISCELLANEOUS) ×2
TROCAR SL VERSASTEP 5M LG B (MISCELLANEOUS) ×2 IMPLANT
TROCAR XCEL 12X100 BLDLESS (ENDOMECHANICALS) ×4 IMPLANT
TROCAR XCEL NON-BLD 5MMX100MML (ENDOMECHANICALS) ×4 IMPLANT
TUBING INSUFFLATOR HEATED (MISCELLANEOUS) ×4 IMPLANT
WATER STERILE IRR 1000ML POUR (IV SOLUTION) ×4 IMPLANT

## 2017-01-02 NOTE — Anesthesia Preprocedure Evaluation (Signed)
Anesthesia Evaluation  Patient identified by MRN, date of birth, ID band Patient awake    Reviewed: Allergy & Precautions, H&P , NPO status , Patient's Chart, lab work & pertinent test results, reviewed documented beta blocker date and time   History of Anesthesia Complications Negative for: history of anesthetic complications  Airway Mallampati: I  TM Distance: >3 FB Neck ROM: full    Dental  (+) Caps, Teeth Intact, Dental Advidsory Given   Pulmonary neg shortness of breath, sleep apnea and Continuous Positive Airway Pressure Ventilation , neg COPD, neg recent URI, former smoker,           Cardiovascular Exercise Tolerance: Good hypertension, (-) angina(-) CAD, (-) Past MI, (-) Cardiac Stents and (-) CABG (-) dysrhythmias (-) Valvular Problems/Murmurs     Neuro/Psych negative neurological ROS  negative psych ROS   GI/Hepatic Neg liver ROS, GERD  Medicated,  Endo/Other  diabetesMorbid obesity  Renal/GU negative Renal ROS  negative genitourinary   Musculoskeletal   Abdominal   Peds  Hematology negative hematology ROS (+)   Anesthesia Other Findings Past Medical History: No date: Diabetes mellitus without complication (HCC) No date: GERD (gastroesophageal reflux disease) No date: Headache No date: Hypertension No date: Sleep apnea     Comment: uses CPAP   Reproductive/Obstetrics negative OB ROS                             Anesthesia Physical Anesthesia Plan  ASA: III  Anesthesia Plan: General   Post-op Pain Management:    Induction:   Airway Management Planned:   Additional Equipment:   Intra-op Plan:   Post-operative Plan:   Informed Consent: I have reviewed the patients History and Physical, chart, labs and discussed the procedure including the risks, benefits and alternatives for the proposed anesthesia with the patient or authorized representative who has indicated  his/her understanding and acceptance.   Dental Advisory Given  Plan Discussed with: Anesthesiologist, CRNA and Surgeon  Anesthesia Plan Comments:         Anesthesia Quick Evaluation

## 2017-01-02 NOTE — Anesthesia Procedure Notes (Signed)
Procedure Name: Intubation Date/Time: 01/02/2017 8:10 AM Performed by: Rockne Coons Pre-anesthesia Checklist: Patient identified, Emergency Drugs available, Suction available, Patient being monitored and Timeout performed Patient Re-evaluated:Patient Re-evaluated prior to inductionOxygen Delivery Method: Circle system utilized Preoxygenation: Pre-oxygenation with 100% oxygen Intubation Type: IV induction Laryngoscope Size: Mac and 3 Grade View: Grade I Tube type: Oral Tube size: 7.0 mm Number of attempts: 1 Airway Equipment and Method: Stylet Placement Confirmation: ETT inserted through vocal cords under direct vision,  positive ETCO2 and breath sounds checked- equal and bilateral Secured at: 24 cm Tube secured with: Tape Dental Injury: Injury to lip  Comments: Small cut to left upper lip

## 2017-01-02 NOTE — Transfer of Care (Signed)
Immediate Anesthesia Transfer of Care Note  Patient: Patricia Gonzalez  Procedure(s) Performed: Procedure(s): LAPAROSCOPIC GASTRIC SLEEVE RESECTION WITH HIATAL HERNIA REPAIR  Patient Location: PACU  Anesthesia Type:General  Level of Consciousness: awake, alert  and oriented  Airway & Oxygen Therapy: Patient Spontanous Breathing and Patient connected to nasal cannula oxygen  Post-op Assessment: Report given to RN, Post -op Vital signs reviewed and stable, Post -op Vital signs reviewed and unstable, Anesthesiologist notified, Patient moving all extremities and Patient moving all extremities X 4  Post vital signs: Reviewed and stable  Last Vitals:  Vitals:   01/02/17 0614  BP: 136/77  Pulse: 76  Resp: 14  Temp: 36.5 C    Last Pain:  Vitals:   01/02/17 0614  TempSrc: Tympanic         Complications: No apparent anesthesia complications

## 2017-01-02 NOTE — Op Note (Signed)
   Pre-op dx: morbid obesity, HH Post-op dx: same Procedure: lap sleeve gastrectomy and hiatal hernia repair Surgeon: Darnell Level AsstLorenda Cahill EBL: none Anesth: GETA Specimen: portion of stomach Bougie: 43 Fr Distance from pylorus: 5 cm  Details of procedure: The patient was taken to the operating room and placed on the operating table in the supine position. Timeout was performed. SCD's were placed. The patient was placed under general anesthesia without incident. Broad spectrum antibiotics were given. A 36 french bougie was placed.  The abdomen was prepped and draped in the usual fashion. It was accessed using a 5 mm optical trocar in the left upper quadrant. Pneumoperitoneum was established without incident. Multiple other trocars were placed in preparation for the procedure. A liver retractor was placed.   The hiatus was explored. No significant hiatal hernia was present. The greater curve was mobilized 5 cm from the pylorus using the harmonic scalpel. This continued dividing the short gastrics and mobilizing the fundus off the left hemidiaphragm. The posterior lesser sac adhesions were divided as well. The Visi-G was advanced to the antrum and the antrum was bisected with an Echelon 60 mm green load stapler. All staple loads had buttressing material. The remainder of the sleeve was created by firing gold load staplers through the left-sided 12 mm port parallel to the lesser curve leaving a small tail at the angle of His. No bleeding or leakage was present at the staple line.\\  The hiatus was explored and a hiatal hernia was evident. Posterior mobilization of the crurae was performed lengthening the intra-abdominal esophagus and the aperture was closed to a reasonable defect using interrupted 0 Surgidac sutures.  The divided gastric tissue was removed without spillage and sent to pathology. The trocars and liver retractor were removed without incident.the 15 mm port site fascia was reapproximated  using an interrupted 0 Vicryl suture using a needle passer. The wounds were closed with 4-0 Vicryl and dermabond.The patient arrived at the recovery room in stable condition.

## 2017-01-02 NOTE — Anesthesia Postprocedure Evaluation (Signed)
Anesthesia Post Note  Patient: SHENETTA MOOK  Procedure(s) Performed: Procedure(s): LAPAROSCOPIC GASTRIC SLEEVE RESECTION WITH HIATAL HERNIA REPAIR  Patient location during evaluation: PACU Anesthesia Type: General Level of consciousness: awake and alert Pain management: pain level controlled Vital Signs Assessment: post-procedure vital signs reviewed and stable Respiratory status: spontaneous breathing, nonlabored ventilation, respiratory function stable and patient connected to nasal cannula oxygen Cardiovascular status: blood pressure returned to baseline and stable Postop Assessment: no signs of nausea or vomiting Anesthetic complications: no     Last Vitals:  Vitals:   01/02/17 1039 01/02/17 1042  BP:  120/64  Pulse: 74 (!) 58  Resp: 18 16  Temp:  36.2 C    Last Pain:  Vitals:   01/02/17 1042  TempSrc:   PainSc: 8                  Martha Clan

## 2017-01-02 NOTE — Progress Notes (Addendum)
Nutrition Note  INTERVENTION:  RD consulted for nutrition education regarding inpatient bariatric surgery.   RD provided "The Liquid Diet" handout from the Bariatric Surgery Guide from the [facility] [place]. This handout previously provided to patient prior to surgery is a duplicate copy. Discussed what foods/liquids are consistent with a Clear Liquid Diet and reinforced Key Concepts such as no carbonation, no caffeine, or sugar containing beverages. Provided methods to prevent dehydration and promote protein intake, using clock and sample fluid schedule. RD encouraged follow-up with outpatient dietitian after discharge.  Teach back method used.  Expect good compliance.  NUTRITION DIAGNOSIS:  Food and nutrition knowledge related deficit related to recent bariatric surgery as evidenced by dietitian consult for nutrition education   GOAL:  Patient will be able to sip and tolerate CL within 24-48 hours  MONITOR:  Energy intake Digestive system  ASSESSMENT:  36 year old female s/p laparoscopic sleeve gastrectomy and hiatal hernia repair on 1/30.   Spoke with patient at bedside. Patient was sipping on apple juice through straw. RD brought patient clear 2 oz cups as guide for sipping beverages and removed straw from bedside. Patient reports her abdomen is sore. She reports she has tolerated some ice chips and sips of apple juice. She forgot to bring her Isopure from home. Patient reports she will ask her sister to bring it in for her. Patient has Bariatric Advantage and Premier Protein supplements at home for when she begins full liquid diet. Patient confirms she has appropriate B-complex supplement for after discharge. She has her follow-up appointment scheduled on 2/13. No further questions at this time.   Body mass index is 39.44 kg/m. Pt meets criteria for Obesity Class II based on current BMI.  Labs and medications reviewed.   Willey Blade, MS, RD, LDN Pager:  [phone] After Hours Pager: [phone]

## 2017-01-02 NOTE — Anesthesia Post-op Follow-up Note (Cosign Needed)
Anesthesia QCDR form completed.        

## 2017-01-02 NOTE — H&P (Signed)
See faxed H and P no changes

## 2017-01-02 NOTE — Plan of Care (Signed)
Problem: Food- and Nutrition-Related Knowledge Deficit (NB-1.1) Goal: Nutrition education Formal process to instruct or train a patient/client in a skill or to impart knowledge to help patients/clients voluntarily manage or modify food choices and eating behavior to maintain or improve health. Outcome: Completed/Met Date Met: 01/02/17 RD consulted for nutrition education regarding inpatient bariatric surgery.   RD provided "The Liquid Diet" handout from the Bariatric Surgery Guide from the [facility] [place]. This handout previously provided to patient prior to surgery is a duplicate copy. Discussed what foods/liquids are consistent with a Clear Liquid Diet and reinforced Key Concepts such as no carbonation, no caffeine, or sugar containing beverages. Provided methods to prevent dehydration and promote protein intake, using clock and sample fluid schedule. RD encouraged follow-up with outpatient dietitian after discharge.  Teach back method used.  Expect good compliance.

## 2017-01-03 ENCOUNTER — Encounter: Payer: Self-pay | Admitting: Specialist

## 2017-01-03 LAB — CBC WITH DIFFERENTIAL/PLATELET
BASOS PCT: 0 %
Basophils Absolute: 0 10*3/uL (ref 0–0.1)
EOS ABS: 0 10*3/uL (ref 0–0.7)
Eosinophils Relative: 0 %
HCT: 35.8 % (ref 35.0–47.0)
HEMOGLOBIN: 12.9 g/dL (ref 12.0–16.0)
Lymphocytes Relative: 24 %
Lymphs Abs: 2.1 10*3/uL (ref 1.0–3.6)
MCH: 33.7 pg (ref 26.0–34.0)
MCHC: 36 g/dL (ref 32.0–36.0)
MCV: 93.6 fL (ref 80.0–100.0)
Monocytes Absolute: 0.8 10*3/uL (ref 0.2–0.9)
Monocytes Relative: 8 %
NEUTROS PCT: 68 %
Neutro Abs: 6.1 10*3/uL (ref 1.4–6.5)
Platelets: 210 10*3/uL (ref 150–440)
RBC: 3.83 MIL/uL (ref 3.80–5.20)
RDW: 13.3 % (ref 11.5–14.5)
WBC: 9.1 10*3/uL (ref 3.6–11.0)

## 2017-01-03 LAB — CREATININE, SERUM: CREATININE: 0.62 mg/dL (ref 0.44–1.00)

## 2017-01-03 LAB — SURGICAL PATHOLOGY

## 2017-01-03 NOTE — Progress Notes (Signed)
IV was removed. Discharge instructions, follow-up appointments, and prescriptions were provided to the pt. All questions answered. The pt was taken downstairs via wheelchair by volunteer services.

## 2017-01-03 NOTE — Discharge Summary (Signed)
Physician Discharge Summary  Patient ID: Patricia Gonzalez MRN: [id number] DOB/AGE: Sep 05, 1981 36 y.o.  Admit date: 01/02/2017 Discharge date: 01/03/2017  Admission Diagnoses: Morbid Obesity; Hypertension  Discharge Diagnoses:  Active Problems:   Morbid obesity ([place])   Discharged Condition: good  Hospital Course: Pt had an uneventful elective sleeve gastrectomy for morbid obesity. She tolerated this well and readily able to achieve discharge criteria by pod1.   Consults: None  Significant Diagnostic Studies: labs: see chart  Treatments: IV hydration and surgery: laparoscopic sleeve gastrectomy; Hiatal Hernia  Discharge Exam: Blood pressure (!) 134/51, pulse (!) 57, temperature 98.4 F (36.9 C), resp. rate 20, height 5\' 5"  (1.651 m), weight 107.5 kg (237 lb), SpO2 98 %.   Disposition: 01-Home or Self Care  Discharge Instructions    Call MD for:  difficulty breathing, headache or visual disturbances    Complete by:  As directed    Call MD for:  extreme fatigue    Complete by:  As directed    Call MD for:  hives    Complete by:  As directed    Call MD for:  persistant dizziness or light-headedness    Complete by:  As directed    Call MD for:  persistant nausea and vomiting    Complete by:  As directed    Call MD for:  redness, tenderness, or signs of infection (pain, swelling, redness, odor or green/yellow discharge around incision site)    Complete by:  As directed    Call MD for:  severe uncontrolled pain    Complete by:  As directed    Call MD for:  temperature >100.4    Complete by:  As directed    Discharge instructions    Complete by:  As directed    Remember to start your chewable or liquid B complex once you arrive home and take this daily for 30 days. You may continue to take this beyond 30 days if you choose. Stick with a Clear liquid diet for 2 days post operatively then you may advance to a Full Liquid diet for 12 days, which means you will be on a strict  liquid diet for 14 days. Your daily goal is going to be to get in 60-80g of protein and 64oz of fluid.   Driving Restrictions    Complete by:  As directed    You may not drive until 24 hours past your last dose of pain medications.   Increase activity slowly    Complete by:  As directed    Lifting restrictions    Complete by:  As directed    Do not lift more than 10-15 pounds for 4-6 weeks post operatively   Other Restrictions    Complete by:  As directed    Avoid using your core muscles for 30 days after surgery - motions like pushing, pulling, climbing etc. Make sure to get up and walk frequently every day to help avoid developing blood clots.     Allergies as of 01/03/2017      Reactions   Ketorolac Tromethamine Anaphylaxis   Shots   Morphine Other (See Comments)   Caused stomach pain      Medication List    STOP taking these medications   atenolol 100 MG tablet Commonly known as:  TENORMIN   labetalol 300 MG tablet Commonly known as:  NORMODYNE   lisinopril-hydrochlorothiazide 20-25 MG tablet Commonly known as:  PRINZIDE,ZESTORETIC   metFORMIN 500 MG 24 hr tablet Commonly  known as:  GLUCOPHAGE-XR   verapamil 180 MG 24 hr capsule Commonly known as:  VERELAN PM     TAKE these medications   butalbital-acetaminophen-caffeine 50-325-40 MG tablet Commonly known as:  FIORICET, ESGIC Take 1-2 tablets by mouth every 4 (four) hours as needed for headache.   cyclobenzaprine 10 MG tablet Commonly known as:  FLEXERIL Take 10 mg by mouth every 8 (eight) hours as needed for muscle spasms.   desogestrel-ethinyl estradiol 0.15-30 MG-MCG tablet Commonly known as:  APRI,EMOQUETTE,SOLIA Take 1 tablet by mouth daily.   DEXILANT 60 MG capsule Generic drug:  dexlansoprazole Take 60 mg by mouth daily.   enoxaparin 40 MG/0.4ML injection Commonly known as:  LOVENOX Inject 0.4 mLs (40 mg total) into the skin daily.   HYDROcodone-acetaminophen 7.5-325 mg/15 ml solution Commonly  known as:  HYCET Take 15 mLs by mouth 4 (four) times daily as needed for moderate pain.   levocetirizine 5 MG tablet Commonly known as:  XYZAL Take 5 mg by mouth every morning.   ondansetron 4 MG disintegrating tablet Commonly known as:  ZOFRAN ODT Take 1 tablet (4 mg total) by mouth every 4 (four) hours as needed for nausea or vomiting.   propranolol 60 MG tablet Commonly known as:  INDERAL Take 60 mg by mouth every morning.            Durable Medical Equipment        Start     Ordered   01/02/17 1120  For home use only DME continuous positive airway pressure (CPAP)  Once    Comments:  Pt has own CPAP. Use her equipment and supplies.  Question Answer Comment  Patient has OSA or probable OSA Yes   Is the patient currently using CPAP in the home Yes   Settings Other see comments   CPAP supplies needed Mask, headgear, cushions, filters, heated tubing and water chamber      01/02/17 1119       Signed: Mardelle Matte 01/03/2017, 1:38 PM

## 2017-01-03 NOTE — Progress Notes (Signed)
Pr refused to walk, stating that she is waiting on her sister to come up here and walk with her.

## 2017-01-08 ENCOUNTER — Other Ambulatory Visit: Payer: Self-pay | Admitting: Neurology

## 2017-01-08 DIAGNOSIS — G43719 Chronic migraine without aura, intractable, without status migrainosus: Secondary | ICD-10-CM

## 2017-01-11 ENCOUNTER — Ambulatory Visit
Admission: RE | Admit: 2017-01-11 | Discharge: 2017-01-11 | Disposition: A | Discharge status: Home / Self Care | Payer: 59 | Source: Ambulatory Visit | Attending: Neurology | Admitting: Neurology

## 2017-01-11 DIAGNOSIS — G43719 Chronic migraine without aura, intractable, without status migrainosus: Secondary | ICD-10-CM | POA: Diagnosis not present

## 2017-01-11 MED ORDER — GADOBENATE DIMEGLUMINE 529 MG/ML IV SOLN
20.0000 mL | Freq: Once | INTRAVENOUS | Status: AC | PRN
  Administered 2017-01-11: 20 mL via INTRAVENOUS

## 2017-01-12 ENCOUNTER — Ambulatory Visit: Payer: 59

## 2017-01-18 ENCOUNTER — Ambulatory Visit: Payer: 59

## 2017-01-18 DIAGNOSIS — G43719 Chronic migraine without aura, intractable, without status migrainosus: Secondary | ICD-10-CM | POA: Insufficient documentation

## 2017-05-05 ENCOUNTER — Encounter: Payer: Self-pay | Admitting: *Deleted

## 2017-05-05 ENCOUNTER — Ambulatory Visit
Admission: EM | Admit: 2017-05-05 | Discharge: 2017-05-05 | Disposition: A | Discharge status: Home / Self Care | Payer: 59 | Attending: Emergency Medicine | Admitting: Emergency Medicine

## 2017-05-05 DIAGNOSIS — L550 Sunburn of first degree: Secondary | ICD-10-CM

## 2017-05-05 DIAGNOSIS — R220 Localized swelling, mass and lump, head: Secondary | ICD-10-CM | POA: Diagnosis not present

## 2017-05-05 DIAGNOSIS — L551 Sunburn of second degree: Secondary | ICD-10-CM | POA: Diagnosis not present

## 2017-05-05 MED ORDER — SILVER SULFADIAZINE 1 % EX CREA: 1.0000 "application " | TOPICAL_CREAM | Freq: Every day | CUTANEOUS | 0 refills | Status: DC

## 2017-05-05 MED ORDER — MUPIROCIN 2 % EX OINT: 1.0000 "application " | TOPICAL_OINTMENT | Freq: Three times a day (TID) | CUTANEOUS | 0 refills | Status: DC

## 2017-05-05 MED ORDER — PREDNISONE 10 MG (21) PO TBPK: ORAL_TABLET | ORAL | 0 refills | Status: DC

## 2017-05-05 MED ORDER — IBUPROFEN 600 MG PO TABS: 600.0000 mg | ORAL_TABLET | Freq: Four times a day (QID) | ORAL | 0 refills | Status: DC | PRN

## 2017-05-05 NOTE — Discharge Instructions (Signed)
Cool compresses, aloe as we discussed. Bactroban on your back for the blisters and crusting. Ibuprofen 600 mg combined with 1 g of Tylenol 3-4 times a day. Finish the steroids unless your doctor tells you to stop.

## 2017-05-05 NOTE — ED Triage Notes (Signed)
Patient started having symptom of swollen face yesterday. Patient was at the beach all week and has evidence of sunburn.

## 2017-05-05 NOTE — ED Provider Notes (Signed)
HPI  SUBJECTIVE:  Patricia Gonzalez is a 36 y.o. female who presents with right-sided facial swelling and swelling inferior to her left eye starting last night. She reports nausea, and a painful blistering rash over her back. States that she accidentally stayed out in the sun for to long while at the beach last week. She denies vomiting, fevers, itching, burning. She describes the pain as throbbing, dull, constant. She tried ibuprofen without improvement in her symptoms. No aggravating factors. No new lotions, soaps, detergents, foods, medications. No lip or tongue swelling. No difficulty breathing, wheezing, abdominal pain, GERD, facial rash. No eye pain, visual changes. No hives. She is status post gastric sleeve. She has a history of diabetes and hypertension, she is a smoker. LMP: Over 1 month ago. She is on continuous OCPs. She denies the possibility of being pregnant and states that we do not need to check. SAY:TKZSWF, Edmonia Lynch, MD    Past Medical History:  Diagnosis Date  . Diabetes mellitus without complication ([place])   . GERD (gastroesophageal reflux disease)   . Headache   . Hypertension   . Sleep apnea    uses CPAP    Past Surgical History:  Procedure Laterality Date  . CHOLECYSTECTOMY    . COLONOSCOPY    . CYST EXCISION    . LAPAROSCOPIC GASTRIC SLEEVE RESECTION WITH HIATAL HERNIA REPAIR  01/02/2017   Procedure: LAPAROSCOPIC GASTRIC SLEEVE RESECTION WITH HIATAL HERNIA REPAIR;  Surgeon: Bonner Puna, MD;  Location: [facility] ORS;  Service: General;;    History reviewed. No pertinent family history.  Social History  Substance Use Topics  . Smoking status: Former Smoker    Packs/day: 0.50    Years: 15.00    Types: Cigarettes    Quit date: 12/27/2015  . Smokeless tobacco: Never Used     Comment: CURRENTLY VAPES  . Alcohol use No    No current facility-administered medications for this encounter.   Current Outpatient Prescriptions:  .  butalbital-acetaminophen-caffeine  (FIORICET, ESGIC) 50-325-40 MG tablet, Take 1-2 tablets by mouth every 4 (four) hours as needed for headache., Disp: , Rfl:  .  desogestrel-ethinyl estradiol (APRI,EMOQUETTE,SOLIA) 0.15-30 MG-MCG tablet, Take 1 tablet by mouth daily., Disp: , Rfl:  .  dexlansoprazole (DEXILANT) 60 MG capsule, Take 60 mg by mouth daily., Disp: , Rfl:  .  levocetirizine (XYZAL) 5 MG tablet, Take 5 mg by mouth every morning., Disp: , Rfl:  .  lisinopril-hydrochlorothiazide (PRINZIDE,ZESTORETIC) 20-25 MG tablet, Take 1 tablet by mouth daily., Disp: , Rfl:  .  propranolol (INDERAL) 60 MG tablet, Take 60 mg by mouth every morning., Disp: , Rfl:  .  SUMAtriptan (IMITREX) 100 MG tablet, Take 100 mg by mouth every 2 (two) hours as needed for migraine. May repeat in 2 hours if headache persists or recurs., Disp: , Rfl:  .  cyclobenzaprine (FLEXERIL) 10 MG tablet, Take 10 mg by mouth every 8 (eight) hours as needed for muscle spasms., Disp: , Rfl:  .  enoxaparin (LOVENOX) 40 MG/0.4ML injection, Inject 0.4 mLs (40 mg total) into the skin daily., Disp: 14 Syringe, Rfl: 0 .  ibuprofen (ADVIL,MOTRIN) 600 MG tablet, Take 1 tablet (600 mg total) by mouth every 6 (six) hours as needed., Disp: 30 tablet, Rfl: 0 .  mupirocin ointment (BACTROBAN) 2 %, Apply 1 application topically 3 (three) times daily., Disp: 22 g, Rfl: 0 .  ondansetron (ZOFRAN ODT) 4 MG disintegrating tablet, Take 1 tablet (4 mg total) by mouth every 4 (four) hours  as needed for nausea or vomiting., Disp: 20 tablet, Rfl: 0 .  predniSONE (STERAPRED UNI-PAK 21 TAB) 10 MG (21) TBPK tablet, Dispense one 6 day pack. Take as directed with food., Disp: 21 tablet, Rfl: 0 .  silver sulfADIAZINE (SILVADENE) 1 % cream, Apply 1 application topically daily., Disp: 50 g, Rfl: 0  Allergies  Allergen Reactions  . Ketorolac Tromethamine Anaphylaxis    Shots  . Morphine Other (See Comments)    Caused stomach pain     ROS  As noted in HPI.   Physical Exam  BP 113/60 (BP  Location: Left Arm)   Pulse 82   Temp 97.8 F (36.6 C) (Oral)   Resp 16   Ht 5\' 5"  (1.651 m)   Wt 192 lb (87.1 kg)   LMP 04/04/2017   SpO2 99%   BMI 31.95 kg/m   Constitutional: Well developed, well nourished, no acute distress Eyes:  EOMI, conjunctiva normal bilaterally. No direct or consensual photophobia HENT: Normocephalic, atraumatic,mucus membranes moist Respiratory: Normal inspiratory effort Cardiovascular: Normal rate GI: nondistended Skin: Positive diffuse tender erythema and increased temperature with blisters, yellowish crusting on her back consistent with a second-degree sunburn. Positive tender diffuse erythema and swelling along the face particularly on the right side.  No blisters. No evidence of airway involvement.      Musculoskeletal: no deformities Neurologic: Alert & oriented x 3, no focal neuro deficits Psychiatric: Speech and behavior appropriate   ED Course   Medications - No data to display  No orders of the defined types were placed in this encounter.   No results found for this or any previous visit (from the past 24 hour(s)). No results found.  ED Clinical Impression  Sunburn of second degree  Sunburn of first degree  Facial swelling   ED Assessment/Plan  Presentation consistent with a first and second-degree sunburn and sun poisoning. Plan sent home with prednisone taper, ibuprofen 600 mg with 1 g of Tylenol 3-4 times a day as needed for pain, fever, swelling. Bactroban for the blistering rash to help prevent secondary infection, recommended aloe. We'll also write a prescription of Silvadene although advised patient that I do not think that this is really necessary. She has no evidence of a keratitis, periorbital cellulitis. Discussed  MDM, plan and followup with patient . Discussed sn/sx that should prompt return to the ED. Patient agrees with plan.   Meds ordered this encounter  Medications  . lisinopril-hydrochlorothiazide  (PRINZIDE,ZESTORETIC) 20-25 MG tablet    Sig: Take 1 tablet by mouth daily.  . SUMAtriptan (IMITREX) 100 MG tablet    Sig: Take 100 mg by mouth every 2 (two) hours as needed for migraine. May repeat in 2 hours if headache persists or recurs.  . predniSONE (STERAPRED UNI-PAK 21 TAB) 10 MG (21) TBPK tablet    Sig: Dispense one 6 day pack. Take as directed with food.    Dispense:  21 tablet    Refill:  0  . ibuprofen (ADVIL,MOTRIN) 600 MG tablet    Sig: Take 1 tablet (600 mg total) by mouth every 6 (six) hours as needed.    Dispense:  30 tablet    Refill:  0  . mupirocin ointment (BACTROBAN) 2 %    Sig: Apply 1 application topically 3 (three) times daily.    Dispense:  22 g    Refill:  0  . silver sulfADIAZINE (SILVADENE) 1 % cream    Sig: Apply 1 application topically daily.    Dispense:  50 g    Refill:  0    *This clinic note was created using [employer]. Therefore, there may be occasional mistakes despite careful proofreading.  ?   Melynda Ripple, MD 05/05/17 [phone]

## 2018-09-20 ENCOUNTER — Encounter: Payer: Self-pay | Admitting: Physician Assistant

## 2018-10-25 ENCOUNTER — Ambulatory Visit (INDEPENDENT_AMBULATORY_CARE_PROVIDER_SITE_OTHER): Payer: BLUE CROSS/BLUE SHIELD | Admitting: Gastroenterology

## 2018-10-25 ENCOUNTER — Encounter: Payer: Self-pay | Admitting: Gastroenterology

## 2018-10-25 ENCOUNTER — Other Ambulatory Visit: Payer: Self-pay

## 2018-10-25 VITALS — BP 138/87 | HR 80 | Resp 17 | Wt 182.0 lb

## 2018-10-25 DIAGNOSIS — K640 First degree hemorrhoids: Secondary | ICD-10-CM

## 2018-10-25 DIAGNOSIS — K625 Hemorrhage of anus and rectum: Secondary | ICD-10-CM | POA: Diagnosis not present

## 2018-10-25 NOTE — Progress Notes (Signed)

## 2018-10-25 NOTE — Progress Notes (Signed)
Cephas Darby, MD [address]  [place]  [place],  [address]  Main: [phone]  Fax: [phone]    Gastroenterology Consultation  Referring Provider:     Linus Salmons, [place]* Primary Care Physician:  Donnie Coffin, MD Primary Gastroenterologist:  Dr. Cephas Darby Reason for Consultation:     Rectal bleeding        HPI:   Patricia Gonzalez is a 37 y.o. female referred by Dr. Donnie Coffin, MD  for consultation & management of chronic rectal bleeding for past 10 years, painless.  She reports dripping into toilet as well as clots.  She had colonoscopy 10 years ago for rectal bleeding and was reportedly normal.  She reports having missed several days at work due to bleeding per rectum that is persistent.  This happens about once every couple of months.  Her most recent hemoglobin is normal.  She denies any other hemorrhoidal symptoms.  She denies weight loss.  She had history of Roux-en-Y gastric bypass and her stools are generally loose.  She denies straining, constipation.  She works in a pharmacy Patient had perirectal abscess in 2015, it was drained at [facility] NSAIDs: None  Antiplts/Anticoagulants/Anti thrombotics: None  GI Procedures: Colonoscopy at [facility] in 2009, reportedly normal  Past Medical History:  Diagnosis Date  . Diabetes mellitus without complication ([place])   . GERD (gastroesophageal reflux disease)   . Headache   . Hypertension   . Sleep apnea    uses CPAP    Past Surgical History:  Procedure Laterality Date  . CHOLECYSTECTOMY    . COLONOSCOPY    . CYST EXCISION    . LAPAROSCOPIC GASTRIC SLEEVE RESECTION WITH HIATAL HERNIA REPAIR  01/02/2017   Procedure: LAPAROSCOPIC GASTRIC SLEEVE RESECTION WITH HIATAL HERNIA REPAIR;  Surgeon: Bonner Puna, MD;  Location: [facility] ORS;  Service: General;;    Current Outpatient Medications:  .  butalbital-acetaminophen-caffeine (FIORICET WITH CODEINE) 50-325-40-30 MG capsule, Take by mouth., Disp: , Rfl:  .   cyclobenzaprine (FLEXERIL) 10 MG tablet, Take 10 mg by mouth every 8 (eight) hours as needed for muscle spasms., Disp: , Rfl:  .  dexlansoprazole (DEXILANT) 60 MG capsule, Take 60 mg by mouth daily., Disp: , Rfl:  .  ibuprofen (ADVIL,MOTRIN) 600 MG tablet, Take 1 tablet (600 mg total) by mouth every 6 (six) hours as needed., Disp: 30 tablet, Rfl: 0 .  levocetirizine (XYZAL) 5 MG tablet, Take 5 mg by mouth every morning., Disp: , Rfl:  .  lisinopril-hydrochlorothiazide (PRINZIDE,ZESTORETIC) 20-25 MG tablet, Take 1 tablet by mouth daily., Disp: , Rfl:  .  ondansetron (ZOFRAN ODT) 4 MG disintegrating tablet, Take 1 tablet (4 mg total) by mouth every 4 (four) hours as needed for nausea or vomiting., Disp: 20 tablet, Rfl: 0 .  PREVIDENT 5000 SENSITIVE 1.1-5 % PSTE, BRUSH WITH PREVIDENT 2 TIMES A DAY, Disp: , Rfl: 5 .  propranolol (INDERAL) 60 MG tablet, Take 60 mg by mouth every morning., Disp: , Rfl:  .  rizatriptan (MAXALT-MLT) 10 MG disintegrating tablet, rizatriptan 10 mg disintegrating tablet  take 1 tablet by mouth ONCE AS NEEDED FOR MIGRAINE FOR UP TO 1 DO...  (REFER TO PRESCRIPTION NOTES)., Disp: , Rfl:  .  SUMAtriptan (IMITREX) 100 MG tablet, Take 100 mg by mouth every 2 (two) hours as needed for migraine. May repeat in 2 hours if headache persists or recurs., Disp: , Rfl:  .  topiramate (TOPAMAX) 25 MG tablet, Take 25  mg by mouth 2 (two) times daily., Disp: , Rfl: 1 .  tretinoin (RETIN-A) 0.025 % cream, tretinoin 0.025 % topical cream, Disp: , Rfl:  .  zolpidem (AMBIEN) 10 MG tablet, TK 1 T PO IMMEDIATELY BEFORE BEDTIME FOR SLEEP, Disp: , Rfl: 5 .  acyclovir (ZOVIRAX) 400 MG tablet, acyclovir 400 mg tablet  take 1 tablet by mouth three times a day for 5 days if needed FLARE, Disp: , Rfl:  .  atenolol (TENORMIN) 100 MG tablet, atenolol 100 mg tablet, Disp: , Rfl:  .  butalbital-acetaminophen-caffeine (FIORICET, ESGIC) 50-325-40 MG tablet, Take 1-2 tablets by mouth every 4 (four) hours as needed  for headache., Disp: , Rfl:  .  cetirizine (ZYRTEC) 10 MG tablet, Take by mouth., Disp: , Rfl:  .  chlorpheniramine-HYDROcodone (TUSSIONEX) 10-8 MG/5ML SUER, hydrocodone 10 mg-chlorpheniramine 8 mg/5 mL oral susp extend.rel 12hr  take 5 milliliters by mouth every 12 hours if needed for cough, Disp: , Rfl:  .  desogestrel-ethinyl estradiol (APRI,EMOQUETTE,SOLIA) 0.15-30 MG-MCG tablet, Take 1 tablet by mouth daily., Disp: , Rfl:  .  enoxaparin (LOVENOX) 40 MG/0.4ML injection, Inject 0.4 mLs (40 mg total) into the skin daily. (Patient not taking: Reported on 10/25/2018), Disp: 14 Syringe, Rfl: 0 .  HYDROcodone-acetaminophen (HYCET) 7.5-325 mg/15 ml solution, Hycet 7.5 mg-325 mg/15 mL oral solution  Take 15 mL every 4 hours by oral route., Disp: , Rfl:  .  HYDROcodone-acetaminophen (NORCO/VICODIN) 5-325 MG tablet, TK 1 T PO Q 6 H PRN P, Disp: , Rfl: 0 .  Influenza vac split quadrivalent PF (FLUARIX) 0.5 ML injection, Fluarix Quad 2017-2018 (PF) 60 mcg (15 mcg x 4)/0.5 mL IM syringe  inject 0.5 milliliter intramuscularly, Disp: , Rfl:  .  metFORMIN (GLUCOPHAGE-XR) 500 MG 24 hr tablet, metformin ER 500 mg tablet,extended release 24 hr, Disp: , Rfl:  .  mupirocin ointment (BACTROBAN) 2 %, Apply 1 application topically 3 (three) times daily. (Patient not taking: Reported on 10/25/2018), Disp: 22 g, Rfl: 0 .  omeprazole (PRILOSEC) 40 MG capsule, omeprazole 40 mg capsule,delayed release, Disp: , Rfl:  .  Oxycodone HCl 10 MG TABS, Take by mouth., Disp: , Rfl:  .  Polyethylene Glycol 3350 (PEG 3350) POWD, Take by mouth., Disp: , Rfl:  .  predniSONE (STERAPRED UNI-PAK 21 TAB) 10 MG (21) TBPK tablet, Dispense one 6 day pack. Take as directed with food. (Patient not taking: Reported on 10/25/2018), Disp: 21 tablet, Rfl: 0 .  propranolol (INDERAL) 40 MG tablet, Take by mouth., Disp: , Rfl:  .  silver sulfADIAZINE (SILVADENE) 1 % cream, Apply 1 application topically daily. (Patient not taking: Reported on 10/25/2018),  Disp: 50 g, Rfl: 0 .  verapamil (CALAN-SR) 180 MG CR tablet, verapamil ER (SR) 180 mg tablet,extended release, Disp: , Rfl:     No family history on file.   Social History   Tobacco Use  . Smoking status: Former Smoker    Packs/day: 0.50    Years: 15.00    Pack years: 7.50    Types: Cigarettes    Last attempt to quit: 12/27/2015    Years since quitting: 2.8  . Smokeless tobacco: Never Used  . Tobacco comment: CURRENTLY VAPES  Substance Use Topics  . Alcohol use: No  . Drug use: No    Allergies as of 10/25/2018 - Review Complete 10/25/2018  Allergen Reaction Noted  . Ketorolac tromethamine Anaphylaxis 03/25/2014  . Morphine Other (See Comments) 03/12/2014    Review of Systems:    All  systems reviewed and negative except where noted in HPI.   Physical Exam:  BP 138/87 (BP Location: Left Arm, Patient Position: Sitting, Cuff Size: Large)   Pulse 80   Resp 17   Wt 182 lb (82.6 kg)   BMI 30.29 kg/m  No LMP recorded. (Menstrual status: Oral contraceptives).  General:   Alert,  Well-developed, well-nourished, pleasant and cooperative in NAD Head:  Normocephalic and atraumatic. Eyes:  Sclera clear, no icterus.   Conjunctiva pink. Ears:  Normal auditory acuity. Nose:  No deformity, discharge, or lesions. Mouth:  No deformity or lesions,oropharynx pink & moist. Neck:  Supple; no masses or thyromegaly. Lungs:  Respirations even and unlabored.  Clear throughout to auscultation.   No wheezes, crackles, or rhonchi. No acute distress. Heart:  Regular rate and rhythm; no murmurs, clicks, rubs, or gallops. Abdomen:  Normal bowel sounds. Soft, non-tender and non-distended without masses, hepatosplenomegaly or hernias noted.  No guarding or rebound tenderness.   Rectal: Nontender, large internal hemorrhoids, normal perianal skin, scar from prior incision and drainage of the perirectal abscess at 5 o'clock position Msk:  Symmetrical without gross deformities. Good, equal movement &  strength bilaterally. Pulses:  Normal pulses noted. Extremities:  No clubbing or edema.  No cyanosis. Neurologic:  Alert and oriented x3;  grossly normal neurologically. Skin:  Intact without significant lesions or rashes. No jaundice. Lymph Nodes:  No significant cervical adenopathy. Psych:  Alert and cooperative. Normal mood and affect.  Imaging Studies: No recent abdominal imaging  Assessment and Plan:   BRYTNI DRAY is a 37 y.o. Caucasian female with history of Roux-en-Y gastric bypass, chronic intermittent painless rectal bleeding.  Her history suggestive of bleeding from internal hemorrhoids  Rectal bleeding Discussed with her about hemorrhoid ligation today and patient is agreeable Consent obtained, perform hemorrhoid ligation today I also recommend her to undergo diagnostic colonoscopy to rule out colorectal malignancy   Follow up in 2 weeks   Cephas Darby, MD

## 2018-11-20 ENCOUNTER — Ambulatory Visit: Payer: BLUE CROSS/BLUE SHIELD | Admitting: Gastroenterology

## 2018-12-06 ENCOUNTER — Encounter: Payer: Self-pay | Admitting: *Deleted

## 2018-12-07 ENCOUNTER — Encounter: Payer: Self-pay | Admitting: Anesthesiology

## 2018-12-09 ENCOUNTER — Ambulatory Visit
Admission: RE | Admit: 2018-12-09 | Discharge: 2018-12-09 | Disposition: A | Discharge status: Home / Self Care | Payer: BLUE CROSS/BLUE SHIELD | Attending: Gastroenterology | Admitting: Gastroenterology

## 2018-12-09 ENCOUNTER — Ambulatory Visit: Payer: BLUE CROSS/BLUE SHIELD | Admitting: Anesthesiology

## 2018-12-09 ENCOUNTER — Encounter: Payer: Self-pay | Admitting: Gastroenterology

## 2018-12-09 ENCOUNTER — Encounter
Admission: RE | Disposition: A | Discharge status: Home / Self Care | Payer: Self-pay | Source: Home / Self Care | Attending: Gastroenterology

## 2018-12-09 DIAGNOSIS — Z79899 Other long term (current) drug therapy: Secondary | ICD-10-CM | POA: Insufficient documentation

## 2018-12-09 DIAGNOSIS — I10 Essential (primary) hypertension: Secondary | ICD-10-CM | POA: Insufficient documentation

## 2018-12-09 DIAGNOSIS — Z79891 Long term (current) use of opiate analgesic: Secondary | ICD-10-CM | POA: Insufficient documentation

## 2018-12-09 DIAGNOSIS — Z885 Allergy status to narcotic agent status: Secondary | ICD-10-CM | POA: Insufficient documentation

## 2018-12-09 DIAGNOSIS — K219 Gastro-esophageal reflux disease without esophagitis: Secondary | ICD-10-CM | POA: Insufficient documentation

## 2018-12-09 DIAGNOSIS — Z793 Long term (current) use of hormonal contraceptives: Secondary | ICD-10-CM | POA: Insufficient documentation

## 2018-12-09 DIAGNOSIS — Z7984 Long term (current) use of oral hypoglycemic drugs: Secondary | ICD-10-CM | POA: Diagnosis not present

## 2018-12-09 DIAGNOSIS — K644 Residual hemorrhoidal skin tags: Secondary | ICD-10-CM | POA: Insufficient documentation

## 2018-12-09 DIAGNOSIS — K625 Hemorrhage of anus and rectum: Secondary | ICD-10-CM

## 2018-12-09 DIAGNOSIS — Z9884 Bariatric surgery status: Secondary | ICD-10-CM | POA: Insufficient documentation

## 2018-12-09 DIAGNOSIS — K626 Ulcer of anus and rectum: Secondary | ICD-10-CM | POA: Diagnosis not present

## 2018-12-09 DIAGNOSIS — E119 Type 2 diabetes mellitus without complications: Secondary | ICD-10-CM | POA: Insufficient documentation

## 2018-12-09 DIAGNOSIS — F17290 Nicotine dependence, other tobacco product, uncomplicated: Secondary | ICD-10-CM | POA: Diagnosis not present

## 2018-12-09 DIAGNOSIS — R51 Headache: Secondary | ICD-10-CM | POA: Insufficient documentation

## 2018-12-09 HISTORY — PX: COLONOSCOPY WITH PROPOFOL: SHX5780

## 2018-12-09 SURGERY — COLONOSCOPY WITH PROPOFOL
Anesthesia: General

## 2018-12-09 MED ORDER — PROPOFOL 10 MG/ML IV BOLUS
INTRAVENOUS | Status: DC | PRN
  Administered 2018-12-09: 20 mg via INTRAVENOUS
  Administered 2018-12-09: 60 mg via INTRAVENOUS
  Administered 2018-12-09: 20 mg via INTRAVENOUS

## 2018-12-09 MED ORDER — PROPOFOL 500 MG/50ML IV EMUL
INTRAVENOUS | Status: AC
  Filled 2018-12-09: qty 50

## 2018-12-09 MED ORDER — SODIUM CHLORIDE 0.9 % IV SOLN
INTRAVENOUS | Status: DC
  Administered 2018-12-09: 1000 mL via INTRAVENOUS

## 2018-12-09 MED ORDER — LIDOCAINE HCL (PF) 2 % IJ SOLN
INTRAMUSCULAR | Status: AC
  Filled 2018-12-09: qty 10

## 2018-12-09 MED ORDER — LIDOCAINE HCL (CARDIAC) PF 100 MG/5ML IV SOSY
PREFILLED_SYRINGE | INTRAVENOUS | Status: DC | PRN
  Administered 2018-12-09: 50 mg via INTRAVENOUS

## 2018-12-09 MED ORDER — PROPOFOL 500 MG/50ML IV EMUL
INTRAVENOUS | Status: DC | PRN
  Administered 2018-12-09: 175 ug/kg/min via INTRAVENOUS

## 2018-12-09 NOTE — H&P (Signed)
Cephas Darby, MD [address]  [place]  [place], [place] [address]  Main: [phone]  Fax: [phone] Pager: [phone]  Primary Care Physician:  Donnie Coffin, MD Primary Gastroenterologist:  Dr. Cephas Darby  Pre-Procedure History & Physical: HPI:  Patricia Gonzalez is a 38 y.o. female is here for an colonoscopy.   Past Medical History:  Diagnosis Date  . Diabetes mellitus without complication ([place])   . GERD (gastroesophageal reflux disease)   . Headache   . Hypertension   . Sleep apnea    uses CPAP    Past Surgical History:  Procedure Laterality Date  . CHOLECYSTECTOMY    . COLONOSCOPY    . CYST EXCISION    . LAPAROSCOPIC GASTRIC SLEEVE RESECTION WITH HIATAL HERNIA REPAIR  01/02/2017   Procedure: LAPAROSCOPIC GASTRIC SLEEVE RESECTION WITH HIATAL HERNIA REPAIR;  Surgeon: Bonner Puna, MD;  Location: [facility] ORS;  Service: General;;    Prior to Admission medications   Medication Sig Start Date End Date Taking? Authorizing Provider  acyclovir (ZOVIRAX) 400 MG tablet acyclovir 400 mg tablet  take 1 tablet by mouth three times a day for 5 days if needed FLARE   Yes [provider]  butalbital-acetaminophen-caffeine (FIORICET WITH CODEINE) 50-325-40-30 MG capsule Take by mouth.   Yes [provider]  cetirizine (ZYRTEC) 10 MG tablet Take by mouth.   Yes [provider]  chlorpheniramine-HYDROcodone (TUSSIONEX) 10-8 MG/5ML SUER hydrocodone 10 mg-chlorpheniramine 8 mg/5 mL oral susp extend.rel 12hr  take 5 milliliters by mouth every 12 hours if needed for cough   Yes [provider]  cyclobenzaprine (FLEXERIL) 10 MG tablet Take 10 mg by mouth every 8 (eight) hours as needed for muscle spasms.   Yes [provider]  desogestrel-ethinyl estradiol (APRI,EMOQUETTE,SOLIA) 0.15-30 MG-MCG tablet Take 1 tablet by mouth daily.   Yes [provider]  dexlansoprazole (DEXILANT) 60 MG capsule Take 60 mg by mouth daily.    Yes [provider]  HYDROcodone-acetaminophen (HYCET) 7.5-325 mg/15 ml solution Hycet 7.5 mg-325 mg/15 mL oral solution  Take 15 mL every 4 hours by oral route.   Yes [provider]  ibuprofen (ADVIL,MOTRIN) 600 MG tablet Take 1 tablet (600 mg total) by mouth every 6 (six) hours as needed. 05/05/17  Yes Melynda Ripple, MD  Influenza vac split quadrivalent PF (FLUARIX) 0.5 ML injection Fluarix Quad 2017-2018 (PF) 60 mcg (15 mcg x 4)/0.5 mL IM syringe  inject 0.5 milliliter intramuscularly   Yes [provider]  levocetirizine (XYZAL) 5 MG tablet Take 5 mg by mouth every morning.   Yes [provider]  lisinopril-hydrochlorothiazide (PRINZIDE,ZESTORETIC) 20-25 MG tablet Take 1 tablet by mouth daily.   Yes [provider]  mupirocin ointment (BACTROBAN) 2 % Apply 1 application topically 3 (three) times daily. 05/05/17  Yes Melynda Ripple, MD  omeprazole (PRILOSEC) 40 MG capsule omeprazole 40 mg capsule,delayed release   Yes [provider]  ondansetron (ZOFRAN ODT) 4 MG disintegrating tablet Take 1 tablet (4 mg total) by mouth every 4 (four) hours as needed for nausea or vomiting. 01/02/17  Yes Mardelle Matte, PA-C  Oxycodone HCl 10 MG TABS Take by mouth. 03/25/14  Yes [provider]  Polyethylene Glycol 3350 (PEG 3350) POWD Take by mouth. 03/25/14  Yes [provider]  predniSONE (STERAPRED UNI-PAK 21 TAB) 10 MG (21) TBPK tablet Dispense one 6 day pack. Take as directed with food. 05/05/17  Yes Melynda Ripple, MD  PREVIDENT 5000  SENSITIVE 1.1-5 % PSTE BRUSH WITH PREVIDENT 2 TIMES A DAY 10/11/18  Yes [provider]  propranolol (INDERAL) 40 MG tablet Take by mouth.   Yes [provider]  propranolol (INDERAL) 60 MG tablet Take 60 mg by mouth every morning.   Yes [provider]  rizatriptan (MAXALT-MLT) 10 MG disintegrating tablet rizatriptan 10 mg disintegrating tablet  take 1 tablet by mouth ONCE AS  NEEDED FOR MIGRAINE FOR UP TO 1 DO...  (REFER TO PRESCRIPTION NOTES). 01/08/17  Yes [provider]  silver sulfADIAZINE (SILVADENE) 1 % cream Apply 1 application topically daily. 05/05/17  Yes Melynda Ripple, MD  SUMAtriptan (IMITREX) 100 MG tablet Take 100 mg by mouth every 2 (two) hours as needed for migraine. May repeat in 2 hours if headache persists or recurs.   Yes [provider]  topiramate (TOPAMAX) 25 MG tablet Take 25 mg by mouth 2 (two) times daily. 09/24/18  Yes [provider]  tretinoin (RETIN-A) 0.025 % cream tretinoin 0.025 % topical cream   Yes [provider]  verapamil (CALAN-SR) 180 MG CR tablet verapamil ER (SR) 180 mg tablet,extended release   Yes [provider]  zolpidem (AMBIEN) 10 MG tablet TK 1 T PO IMMEDIATELY BEFORE BEDTIME FOR SLEEP 09/19/18  Yes [provider]  atenolol (TENORMIN) 100 MG tablet atenolol 100 mg tablet    [provider]  butalbital-acetaminophen-caffeine (FIORICET, ESGIC) 50-325-40 MG tablet Take 1-2 tablets by mouth every 4 (four) hours as needed for headache.    [provider]  enoxaparin (LOVENOX) 40 MG/0.4ML injection Inject 0.4 mLs (40 mg total) into the skin daily. Patient not taking: Reported on 10/25/2018 01/02/17   Mardelle Matte, PA-C  HYDROcodone-acetaminophen (NORCO/VICODIN) 5-325 MG tablet TK 1 T PO Q 6 H PRN P 09/07/18   [provider]  metFORMIN (GLUCOPHAGE-XR) 500 MG 24 hr tablet metformin ER 500 mg tablet,extended release 24 hr 01/19/16   [provider]    Allergies as of 10/25/2018 - Review Complete 10/25/2018  Allergen Reaction Noted  . Ketorolac tromethamine Anaphylaxis 03/25/2014  . Morphine Other (See Comments) 03/12/2014    History reviewed. No pertinent family history.  Social History   Socioeconomic History  . Marital status: Married    Spouse name: Not on file  . Number of children: Not on file  . Years of education: Not on  file  . Highest education level: Not on file  Occupational History  . Not on file  Social Needs  . Financial resource strain: Not on file  . Food insecurity:    Worry: Not on file    Inability: Not on file  . Transportation needs:    Medical: Not on file    Non-medical: Not on file  Tobacco Use  . Smoking status: Former Smoker    Packs/day: 0.50    Years: 15.00    Pack years: 7.50    Types: Cigarettes    Last attempt to quit: 12/27/2015    Years since quitting: 2.9  . Smokeless tobacco: Never Used  . Tobacco comment: CURRENTLY VAPES  Substance and Sexual Activity  . Alcohol use: No  . Drug use: No  . Sexual activity: Not on file  Lifestyle  . Physical activity:    Days per week: Not on file    Minutes per session: Not on file  . Stress: Not on file  Relationships  . Social connections:    Talks on phone: Not on file  Gets together: Not on file    Attends religious service: Not on file    Active member of club or organization: Not on file    Attends meetings of clubs or organizations: Not on file    Relationship status: Not on file  . Intimate partner violence:    Fear of current or ex partner: Not on file    Emotionally abused: Not on file    Physically abused: Not on file    Forced sexual activity: Not on file  Other Topics Concern  . Not on file  Social History Narrative  . Not on file    Review of Systems: See HPI, otherwise negative ROS  Physical Exam: BP 128/79   Pulse (!) 52   Temp (!) 97.5 F (36.4 C) (Tympanic)   Resp 20   Ht 5\' 5"  (1.651 m)   Wt 78.5 kg   SpO2 100%   BMI 28.79 kg/m  General:   Alert,  pleasant and cooperative in NAD Head:  Normocephalic and atraumatic. Neck:  Supple; no masses or thyromegaly. Lungs:  Clear throughout to auscultation.    Heart:  Regular rate and rhythm. Abdomen:  Soft, nontender and nondistended. Normal bowel sounds, without guarding, and without rebound.   Neurologic:  Alert and  oriented x4;  grossly  normal neurologically.  Impression/Plan: Patricia Gonzalez is here for an colonoscopy to be performed for rectal bleeding  Risks, benefits, limitations, and alternatives regarding  colonoscopy have been reviewed with the patient.  Questions have been answered.  All parties agreeable.   Sherri Sear, MD  12/09/2018, 8:39 AM

## 2018-12-09 NOTE — Transfer of Care (Signed)
Immediate Anesthesia Transfer of Care Note  Patient: Patricia Gonzalez  Procedure(s) Performed: COLONOSCOPY WITH PROPOFOL (N/A )  Patient Location: PACU  Anesthesia Type:General  Level of Consciousness: drowsy  Airway & Oxygen Therapy: Patient Spontanous Breathing  Post-op Assessment: Report given to RN and Post -op Vital signs reviewed and stable  Post vital signs: Reviewed and stable  Last Vitals:  Vitals Value Taken Time  BP 112/58 12/09/2018  9:33 AM  Temp 36 C 12/09/2018  9:33 AM  Pulse 58 12/09/2018  9:33 AM  Resp 20 12/09/2018  9:33 AM  SpO2 99 % 12/09/2018  9:33 AM  Vitals shown include unvalidated device data.  Last Pain:  Vitals:   12/09/18 0933  TempSrc: Tympanic  PainSc:          Complications: No apparent anesthesia complications

## 2018-12-09 NOTE — Anesthesia Postprocedure Evaluation (Signed)
Anesthesia Post Note  Patient: Patricia Gonzalez  Procedure(s) Performed: COLONOSCOPY WITH PROPOFOL (N/A )  Patient location during evaluation: Endoscopy Anesthesia Type: General Level of consciousness: awake and alert Pain management: pain level controlled Vital Signs Assessment: post-procedure vital signs reviewed and stable Respiratory status: spontaneous breathing, nonlabored ventilation, respiratory function stable and patient connected to nasal cannula oxygen Cardiovascular status: blood pressure returned to baseline and stable Postop Assessment: no apparent nausea or vomiting Anesthetic complications: no     Last Vitals:  Vitals:   12/09/18 0953 12/09/18 1003  BP: (!) 141/78 (!) 141/79  Pulse: (!) 58 (!) 46  Resp: (!) 24 18  Temp:    SpO2: 100% 100%    Last Pain:  Vitals:   12/09/18 1003  TempSrc:   PainSc: 0-No pain                 Kathrin Folden S

## 2018-12-09 NOTE — Anesthesia Post-op Follow-up Note (Signed)
Anesthesia QCDR form completed.        

## 2018-12-09 NOTE — Op Note (Signed)
[facility] Gastroenterology Patient Name: Uriel Horkey Procedure Date: 12/09/2018 9:09 AM MRN: [id number] Account #: [id number] Date of Birth: [birth year] Admit Type: Outpatient Age: 38 Room: [facility] ENDO ROOM 2 Gender: Female Note Status: Finalized Procedure:            Colonoscopy Indications:          Rectal bleeding Providers:            Lin Landsman MD, MD Medicines:            Monitored Anesthesia Care Complications:        No immediate complications. Estimated blood loss: None. Procedure:            Pre-Anesthesia Assessment:                       - Prior to the procedure, a History and Physical was                        performed, and patient medications and allergies were                        reviewed. The patient is competent. The risks and                        benefits of the procedure and the sedation options and                        risks were discussed with the patient. All questions                        were answered and informed consent was obtained.                        Patient identification and proposed procedure were                        verified by the physician, the nurse, the                        anesthesiologist, the anesthetist and the technician in                        the pre-procedure area in the procedure room in the                        endoscopy suite. Mental Status Examination: alert and                        oriented. Airway Examination: normal oropharyngeal                        airway and neck mobility. Respiratory Examination:                        clear to auscultation. CV Examination: normal.                        Prophylactic Antibiotics: The patient does not require  prophylactic antibiotics. Prior Anticoagulants: The                        patient has taken no previous anticoagulant or                        antiplatelet agents. ASA Grade Assessment: III - A                         patient with severe systemic disease. After reviewing                        the risks and benefits, the patient was deemed in                        satisfactory condition to undergo the procedure. The                        anesthesia plan was to use monitored anesthesia care                        (MAC). Immediately prior to administration of                        medications, the patient was re-assessed for adequacy                        to receive sedatives. The heart rate, respiratory rate,                        oxygen saturations, blood pressure, adequacy of                        pulmonary ventilation, and response to care were                        monitored throughout the procedure. The physical status                        of the patient was re-assessed after the procedure.                       After obtaining informed consent, the colonoscope was                        passed under direct vision. Throughout the procedure,                        the patient's blood pressure, pulse, and oxygen                        saturations were monitored continuously. The                        Colonoscope was introduced through the anus and                        advanced to the the cecum, identified by appendiceal  orifice and ileocecal valve. The colonoscopy was                        somewhat difficult due to inadequate bowel prep.                        Successful completion of the procedure was aided by                        lavage. The patient tolerated the procedure well. The                        quality of the bowel preparation was evaluated using                        the BBPS [facility] Bowel Preparation Scale) with scores                        of: Right Colon = 2 (minor amount of residual staining,                        small fragments of stool and/or opaque liquid, but                        mucosa seen well), Transverse Colon = 2 (minor amount                         of residual staining, small fragments of stool and/or                        opaque liquid, but mucosa seen well) and Left Colon = 2                        (minor amount of residual staining, small fragments of                        stool and/or opaque liquid, but mucosa seen well). The                        total BBPS score equals 6. The quality of the bowel                        preparation was fair. Findings:      The perianal and digital rectal examinations were normal. Pertinent       negatives include normal sphincter tone and no palpable rectal lesions.      Normal mucosa was found in the entire colon.      Non-bleeding external hemorrhoids were found during retroflexion. The       hemorrhoids were medium-sized.      A moderate amount of semi-liquid stool was found in the entire colon.      A single (solitary) three mm ulcer was found in the rectum upon       retroflexion, representing prior recent banding site. No bleeding was       present. No stigmata of recent bleeding were seen. Impression:           - Preparation of the colon was fair.                       -  Normal mucosa in the entire examined colon.                       - Non-bleeding external hemorrhoids.                       - Stool in the entire examined colon.                       - A single (solitary) ulcer in the rectum.                       - No specimens collected. Recommendation:       - Discharge patient to home (with escort).                       - Resume previous diet today.                       - Continue present medications.                       - Repeat colonoscopy in 10 years for screening purposes.                       - Return to my office as previously scheduled for                        hemorrhoid ligation. Procedure Code(s):    --- Professional ---                       [phone], Colonoscopy, flexible; diagnostic, including                        collection of  specimen(s) by brushing or washing, when                        performed (separate procedure) Diagnosis Code(s):    --- Professional ---                       K64.4, Residual hemorrhoidal skin tags                       K62.6, Ulcer of anus and rectum                       K62.5, Hemorrhage of anus and rectum CPT copyright 2018 [employer]. All rights reserved. The codes documented in this report are preliminary and upon coder review may  be revised to meet current compliance requirements. Dr. Ulyess Mort Lin Landsman MD, MD 12/09/2018 9:32:03 AM This report has been signed electronically. Number of Addenda: 0 Note Initiated On: 12/09/2018 9:09 AM Scope Withdrawal Time: 0 hours 5 minutes 22 seconds  Total Procedure Duration: 0 hours 9 minutes 19 seconds       [facility]

## 2018-12-09 NOTE — Anesthesia Procedure Notes (Signed)
Date/Time: 12/09/2018 9:13 AM Performed by: Johnna Acosta, CRNA Pre-anesthesia Checklist: Patient identified, Emergency Drugs available, Suction available, Patient being monitored and Timeout performed Patient Re-evaluated:Patient Re-evaluated prior to induction Oxygen Delivery Method: Nasal cannula Preoxygenation: Pre-oxygenation with 100% oxygen Induction Type: IV induction

## 2018-12-09 NOTE — Anesthesia Preprocedure Evaluation (Addendum)
Anesthesia Evaluation  Patient identified by MRN, date of birth, ID band Patient awake    Reviewed: Allergy & Precautions, NPO status , Patient's Chart, lab work & pertinent test results, reviewed documented beta blocker date and time   Airway Mallampati: III  TM Distance: >3 FB     Dental  (+) Chipped   Pulmonary former smoker,           Cardiovascular hypertension, Pt. on medications and Pt. on home beta blockers      Neuro/Psych  Headaches,    GI/Hepatic GERD  ,  Endo/Other  diabetes, Type 2  Renal/GU      Musculoskeletal   Abdominal   Peds  Hematology   Anesthesia Other Findings Gastric sleeve. No problems with sleep apnea after surgery.  Reproductive/Obstetrics                            Anesthesia Physical Anesthesia Plan  ASA: III  Anesthesia Plan: General   Post-op Pain Management:    Induction: Intravenous  PONV Risk Score and Plan:   Airway Management Planned:   Additional Equipment:   Intra-op Plan:   Post-operative Plan:   Informed Consent: I have reviewed the patients History and Physical, chart, labs and discussed the procedure including the risks, benefits and alternatives for the proposed anesthesia with the patient or authorized representative who has indicated his/her understanding and acceptance.     Plan Discussed with: CRNA  Anesthesia Plan Comments:         Anesthesia Quick Evaluation

## 2018-12-16 ENCOUNTER — Ambulatory Visit: Payer: BLUE CROSS/BLUE SHIELD | Admitting: Gastroenterology

## 2018-12-26 ENCOUNTER — Ambulatory Visit: Payer: BLUE CROSS/BLUE SHIELD | Admitting: Gastroenterology

## 2019-01-02 ENCOUNTER — Other Ambulatory Visit: Payer: Self-pay

## 2019-01-02 ENCOUNTER — Inpatient Hospital Stay
Admission: EM | Admit: 2019-01-02 | Discharge: 2019-01-04 | DRG: 690 | Disposition: A | Discharge status: Home / Self Care | Payer: BLUE CROSS/BLUE SHIELD | Attending: Internal Medicine | Admitting: Internal Medicine

## 2019-01-02 ENCOUNTER — Encounter: Payer: Self-pay | Admitting: Emergency Medicine

## 2019-01-02 DIAGNOSIS — N10 Acute tubulo-interstitial nephritis: Principal | ICD-10-CM | POA: Diagnosis present

## 2019-01-02 DIAGNOSIS — D271 Benign neoplasm of left ovary: Secondary | ICD-10-CM

## 2019-01-02 DIAGNOSIS — Z79899 Other long term (current) drug therapy: Secondary | ICD-10-CM

## 2019-01-02 DIAGNOSIS — N179 Acute kidney failure, unspecified: Secondary | ICD-10-CM

## 2019-01-02 DIAGNOSIS — R1084 Generalized abdominal pain: Secondary | ICD-10-CM

## 2019-01-02 DIAGNOSIS — N390 Urinary tract infection, site not specified: Secondary | ICD-10-CM

## 2019-01-02 DIAGNOSIS — I10 Essential (primary) hypertension: Secondary | ICD-10-CM | POA: Diagnosis present

## 2019-01-02 DIAGNOSIS — N170 Acute kidney failure with tubular necrosis: Secondary | ICD-10-CM | POA: Diagnosis present

## 2019-01-02 DIAGNOSIS — T464X5A Adverse effect of angiotensin-converting-enzyme inhibitors, initial encounter: Secondary | ICD-10-CM | POA: Diagnosis present

## 2019-01-02 DIAGNOSIS — G4730 Sleep apnea, unspecified: Secondary | ICD-10-CM | POA: Diagnosis present

## 2019-01-02 DIAGNOSIS — Z7984 Long term (current) use of oral hypoglycemic drugs: Secondary | ICD-10-CM

## 2019-01-02 DIAGNOSIS — E876 Hypokalemia: Secondary | ICD-10-CM | POA: Diagnosis present

## 2019-01-02 DIAGNOSIS — R52 Pain, unspecified: Secondary | ICD-10-CM

## 2019-01-02 DIAGNOSIS — Z888 Allergy status to other drugs, medicaments and biological substances status: Secondary | ICD-10-CM

## 2019-01-02 DIAGNOSIS — N059 Unspecified nephritic syndrome with unspecified morphologic changes: Secondary | ICD-10-CM | POA: Diagnosis present

## 2019-01-02 DIAGNOSIS — Z885 Allergy status to narcotic agent status: Secondary | ICD-10-CM

## 2019-01-02 DIAGNOSIS — E119 Type 2 diabetes mellitus without complications: Secondary | ICD-10-CM | POA: Diagnosis present

## 2019-01-02 DIAGNOSIS — Z87891 Personal history of nicotine dependence: Secondary | ICD-10-CM

## 2019-01-02 DIAGNOSIS — Z9884 Bariatric surgery status: Secondary | ICD-10-CM

## 2019-01-02 DIAGNOSIS — K219 Gastro-esophageal reflux disease without esophagitis: Secondary | ICD-10-CM | POA: Diagnosis present

## 2019-01-02 DIAGNOSIS — R109 Unspecified abdominal pain: Secondary | ICD-10-CM

## 2019-01-02 DIAGNOSIS — R809 Proteinuria, unspecified: Secondary | ICD-10-CM

## 2019-01-02 LAB — COMPREHENSIVE METABOLIC PANEL
ALT: 25 U/L (ref 0–44)
AST: 26 U/L (ref 15–41)
Albumin: 4.2 g/dL (ref 3.5–5.0)
Alkaline Phosphatase: 57 U/L (ref 38–126)
Anion gap: 8 (ref 5–15)
BUN: 22 mg/dL — ABNORMAL HIGH (ref 6–20)
CO2: 27 mmol/L (ref 22–32)
Calcium: 8.6 mg/dL — ABNORMAL LOW (ref 8.9–10.3)
Chloride: 103 mmol/L (ref 98–111)
Creatinine, Ser: 1.39 mg/dL — ABNORMAL HIGH (ref 0.44–1.00)
GFR calc non Af Amer: 48 mL/min — ABNORMAL LOW (ref >60)
GFR, EST AFRICAN AMERICAN: 55 mL/min — AB (ref >60)
Glucose, Bld: 124 mg/dL — ABNORMAL HIGH (ref 70–99)
Potassium: 3 mmol/L — ABNORMAL LOW (ref 3.5–5.1)
Sodium: 138 mmol/L (ref 135–145)
Total Bilirubin: 0.5 mg/dL (ref 0.3–1.2)
Total Protein: 6.9 g/dL (ref 6.5–8.1)

## 2019-01-02 LAB — POCT PREGNANCY, URINE: Preg Test, Ur: NEGATIVE

## 2019-01-02 LAB — CBC
HCT: 40.2 % (ref 36.0–46.0)
Hemoglobin: 14.1 g/dL (ref 12.0–15.0)
MCH: 33.1 pg (ref 26.0–34.0)
MCHC: 35.1 g/dL (ref 30.0–36.0)
MCV: 94.4 fL (ref 80.0–100.0)
NRBC: 0 % (ref 0.0–0.2)
PLATELETS: 210 10*3/uL (ref 150–400)
RBC: 4.26 MIL/uL (ref 3.87–5.11)
RDW: 11.8 % (ref 11.5–15.5)
WBC: 11.6 10*3/uL — ABNORMAL HIGH (ref 4.0–10.5)

## 2019-01-02 LAB — LIPASE, BLOOD: LIPASE: 34 U/L (ref 11–51)

## 2019-01-02 MED ORDER — ONDANSETRON 4 MG PO TBDP
4.0000 mg | ORAL_TABLET | Freq: Once | ORAL | Status: AC | PRN
  Administered 2019-01-02: 4 mg via ORAL
  Filled 2019-01-02: qty 1

## 2019-01-02 NOTE — ED Triage Notes (Signed)
Pt ambulates to triage room in NAD. Pt reports generalized abdominal pain and right/left flank pain that started today at 1730. Pt reports nausea but denies emesis or diarrhea.

## 2019-01-03 ENCOUNTER — Encounter: Payer: Self-pay | Admitting: Radiology

## 2019-01-03 ENCOUNTER — Emergency Department: Payer: BLUE CROSS/BLUE SHIELD

## 2019-01-03 ENCOUNTER — Other Ambulatory Visit: Payer: Self-pay

## 2019-01-03 DIAGNOSIS — R109 Unspecified abdominal pain: Secondary | ICD-10-CM

## 2019-01-03 DIAGNOSIS — N179 Acute kidney failure, unspecified: Secondary | ICD-10-CM

## 2019-01-03 DIAGNOSIS — K219 Gastro-esophageal reflux disease without esophagitis: Secondary | ICD-10-CM | POA: Diagnosis present

## 2019-01-03 DIAGNOSIS — G4730 Sleep apnea, unspecified: Secondary | ICD-10-CM | POA: Diagnosis present

## 2019-01-03 DIAGNOSIS — Z79899 Other long term (current) drug therapy: Secondary | ICD-10-CM | POA: Diagnosis not present

## 2019-01-03 DIAGNOSIS — N10 Acute tubulo-interstitial nephritis: Secondary | ICD-10-CM

## 2019-01-03 DIAGNOSIS — Z885 Allergy status to narcotic agent status: Secondary | ICD-10-CM | POA: Diagnosis not present

## 2019-01-03 DIAGNOSIS — N059 Unspecified nephritic syndrome with unspecified morphologic changes: Secondary | ICD-10-CM | POA: Diagnosis present

## 2019-01-03 DIAGNOSIS — T464X5A Adverse effect of angiotensin-converting-enzyme inhibitors, initial encounter: Secondary | ICD-10-CM | POA: Diagnosis present

## 2019-01-03 DIAGNOSIS — Z888 Allergy status to other drugs, medicaments and biological substances status: Secondary | ICD-10-CM | POA: Diagnosis not present

## 2019-01-03 DIAGNOSIS — E876 Hypokalemia: Secondary | ICD-10-CM | POA: Diagnosis present

## 2019-01-03 DIAGNOSIS — I10 Essential (primary) hypertension: Secondary | ICD-10-CM | POA: Diagnosis present

## 2019-01-03 DIAGNOSIS — Z7984 Long term (current) use of oral hypoglycemic drugs: Secondary | ICD-10-CM | POA: Diagnosis not present

## 2019-01-03 DIAGNOSIS — Z9884 Bariatric surgery status: Secondary | ICD-10-CM | POA: Diagnosis not present

## 2019-01-03 DIAGNOSIS — N170 Acute kidney failure with tubular necrosis: Secondary | ICD-10-CM | POA: Diagnosis present

## 2019-01-03 DIAGNOSIS — E119 Type 2 diabetes mellitus without complications: Secondary | ICD-10-CM | POA: Diagnosis present

## 2019-01-03 DIAGNOSIS — R1084 Generalized abdominal pain: Secondary | ICD-10-CM

## 2019-01-03 DIAGNOSIS — Z87891 Personal history of nicotine dependence: Secondary | ICD-10-CM | POA: Diagnosis not present

## 2019-01-03 HISTORY — DX: Acute pyelonephritis: N10

## 2019-01-03 LAB — BASIC METABOLIC PANEL
ANION GAP: 7 (ref 5–15)
BUN: 22 mg/dL — ABNORMAL HIGH (ref 6–20)
CO2: 27 mmol/L (ref 22–32)
Calcium: 8.1 mg/dL — ABNORMAL LOW (ref 8.9–10.3)
Chloride: 106 mmol/L (ref 98–111)
Creatinine, Ser: 1.59 mg/dL — ABNORMAL HIGH (ref 0.44–1.00)
GFR calc non Af Amer: 40 mL/min — ABNORMAL LOW (ref >60)
GFR, EST AFRICAN AMERICAN: 47 mL/min — AB (ref >60)
Glucose, Bld: 148 mg/dL — ABNORMAL HIGH (ref 70–99)
Potassium: 3 mmol/L — ABNORMAL LOW (ref 3.5–5.1)
Sodium: 140 mmol/L (ref 135–145)

## 2019-01-03 LAB — MAGNESIUM: MAGNESIUM: 2.1 mg/dL (ref 1.7–2.4)

## 2019-01-03 LAB — URINALYSIS, COMPLETE (UACMP) WITH MICROSCOPIC
Bilirubin Urine: NEGATIVE
Glucose, UA: NEGATIVE mg/dL
Ketones, ur: NEGATIVE mg/dL
Leukocytes, UA: NEGATIVE
Nitrite: NEGATIVE
Protein, ur: 30 mg/dL — AB
Specific Gravity, Urine: 1.006 (ref 1.005–1.030)
pH: 6 (ref 5.0–8.0)

## 2019-01-03 LAB — GLUCOSE, CAPILLARY
Glucose-Capillary: 99 mg/dL (ref 70–99)
Glucose-Capillary: 77 mg/dL (ref 70–99)
Glucose-Capillary: 74 mg/dL (ref 70–99)
Glucose-Capillary: 101 mg/dL — ABNORMAL HIGH (ref 70–99)

## 2019-01-03 LAB — PROTEIN / CREATININE RATIO, URINE
CREATININE, URINE: 33 mg/dL
Protein Creatinine Ratio: 1.27 mg/mg{Cre} — ABNORMAL HIGH (ref 0.00–0.15)
Total Protein, Urine: 42 mg/dL

## 2019-01-03 LAB — CBC
HCT: 36.9 % (ref 36.0–46.0)
Hemoglobin: 12.9 g/dL (ref 12.0–15.0)
MCH: 33.2 pg (ref 26.0–34.0)
MCHC: 35 g/dL (ref 30.0–36.0)
MCV: 95.1 fL (ref 80.0–100.0)
Platelets: 187 10*3/uL (ref 150–400)
RBC: 3.88 MIL/uL (ref 3.87–5.11)
RDW: 11.9 % (ref 11.5–15.5)
WBC: 7.6 10*3/uL (ref 4.0–10.5)
nRBC: 0 % (ref 0.0–0.2)

## 2019-01-03 LAB — URIC ACID: Uric Acid, Serum: 6.2 mg/dL (ref 2.5–7.1)

## 2019-01-03 MED ORDER — ATENOLOL 100 MG PO TABS: 100.0000 mg | ORAL_TABLET | Freq: Every day | ORAL | Status: DC

## 2019-01-03 MED ORDER — OXYCODONE-ACETAMINOPHEN 5-325 MG PO TABS: 1.0000 | ORAL_TABLET | Freq: Once | ORAL | Status: DC

## 2019-01-03 MED ORDER — ONDANSETRON HCL 4 MG/2ML IJ SOLN
4.0000 mg | INTRAMUSCULAR | Status: AC
  Administered 2019-01-03: 4 mg via INTRAVENOUS
  Filled 2019-01-03: qty 2

## 2019-01-03 MED ORDER — SODIUM CHLORIDE 0.9 % IV SOLN
1.0000 g | INTRAVENOUS | Status: AC
  Administered 2019-01-03: 1 g via INTRAVENOUS
  Filled 2019-01-03: qty 10

## 2019-01-03 MED ORDER — SODIUM CHLORIDE 0.9 % IV SOLN
1.0000 g | INTRAVENOUS | Status: DC
  Administered 2019-01-03: 1 g via INTRAVENOUS
  Filled 2019-01-03: qty 10
  Filled 2019-01-03: qty 1

## 2019-01-03 MED ORDER — IOPAMIDOL (ISOVUE-300) INJECTION 61%
100.0000 mL | Freq: Once | INTRAVENOUS | Status: AC | PRN
  Administered 2019-01-03: 100 mL via INTRAVENOUS

## 2019-01-03 MED ORDER — SODIUM CHLORIDE 0.9 % IV SOLN: INTRAVENOUS | Status: DC

## 2019-01-03 MED ORDER — BUTALBITAL-APAP-CAFFEINE 50-325-40 MG PO TABS: 1.0000 | ORAL_TABLET | ORAL | Status: DC | PRN

## 2019-01-03 MED ORDER — FLUCONAZOLE 100 MG PO TABS
100.0000 mg | ORAL_TABLET | Freq: Every day | ORAL | Status: DC
  Administered 2019-01-03 – 2019-01-04 (×2): 100 mg via ORAL
  Filled 2019-01-03 (×2): qty 1

## 2019-01-03 MED ORDER — TOPIRAMATE 25 MG PO TABS
25.0000 mg | ORAL_TABLET | Freq: Two times a day (BID) | ORAL | Status: DC
  Administered 2019-01-03 – 2019-01-04 (×2): 25 mg via ORAL
  Filled 2019-01-03 (×3): qty 1

## 2019-01-03 MED ORDER — HYDROMORPHONE HCL 1 MG/ML IJ SOLN
1.0000 mg | INTRAMUSCULAR | Status: AC
  Administered 2019-01-03: 1 mg via INTRAVENOUS
  Filled 2019-01-03: qty 1

## 2019-01-03 MED ORDER — OXYCODONE-ACETAMINOPHEN 5-325 MG PO TABS: 1.0000 | ORAL_TABLET | ORAL | Status: DC | PRN

## 2019-01-03 MED ORDER — SUMATRIPTAN SUCCINATE 50 MG PO TABS
100.0000 mg | ORAL_TABLET | ORAL | Status: DC | PRN
  Filled 2019-01-03: qty 2

## 2019-01-03 MED ORDER — VERAPAMIL HCL ER 180 MG PO TBCR: 180.0000 mg | EXTENDED_RELEASE_TABLET | Freq: Every day | ORAL | Status: DC

## 2019-01-03 MED ORDER — PROPRANOLOL HCL ER 60 MG PO CP24
60.0000 mg | ORAL_CAPSULE | Freq: Every day | ORAL | Status: DC
  Administered 2019-01-03 – 2019-01-04 (×2): 60 mg via ORAL
  Filled 2019-01-03 (×2): qty 1

## 2019-01-03 MED ORDER — POTASSIUM CHLORIDE CRYS ER 20 MEQ PO TBCR
40.0000 meq | EXTENDED_RELEASE_TABLET | Freq: Two times a day (BID) | ORAL | Status: AC
  Administered 2019-01-03 (×2): 40 meq via ORAL
  Filled 2019-01-03 (×2): qty 2

## 2019-01-03 MED ORDER — HYDROMORPHONE HCL 1 MG/ML IJ SOLN: 1.0000 mg | INTRAMUSCULAR | Status: AC

## 2019-01-03 MED ORDER — HEPARIN SODIUM (PORCINE) 5000 UNIT/ML IJ SOLN
5000.0000 [IU] | Freq: Three times a day (TID) | INTRAMUSCULAR | Status: DC
  Administered 2019-01-03 – 2019-01-04 (×4): 5000 [IU] via SUBCUTANEOUS
  Filled 2019-01-03 (×4): qty 1

## 2019-01-03 MED ORDER — DOCUSATE SODIUM 100 MG PO CAPS: 100.0000 mg | ORAL_CAPSULE | Freq: Two times a day (BID) | ORAL | Status: DC | PRN

## 2019-01-03 MED ORDER — PANTOPRAZOLE SODIUM 40 MG PO TBEC
40.0000 mg | DELAYED_RELEASE_TABLET | Freq: Every day | ORAL | Status: DC
  Administered 2019-01-03 – 2019-01-04 (×2): 40 mg via ORAL
  Filled 2019-01-03 (×2): qty 1

## 2019-01-03 MED ORDER — SODIUM CHLORIDE 0.9 % IV SOLN
INTRAVENOUS | Status: DC
  Administered 2019-01-03 – 2019-01-04 (×2): via INTRAVENOUS

## 2019-01-03 MED ORDER — ONDANSETRON HCL 4 MG/2ML IJ SOLN: 4.0000 mg | Freq: Four times a day (QID) | INTRAMUSCULAR | Status: DC | PRN

## 2019-01-03 MED ORDER — MORPHINE SULFATE (PF) 4 MG/ML IV SOLN
4.0000 mg | Freq: Once | INTRAVENOUS | Status: AC
  Administered 2019-01-03: 4 mg via INTRAVENOUS
  Filled 2019-01-03: qty 1

## 2019-01-03 MED ORDER — ADULT MULTIVITAMIN W/MINERALS CH
1.0000 | ORAL_TABLET | Freq: Every day | ORAL | Status: DC
  Administered 2019-01-04: 1 via ORAL
  Filled 2019-01-03: qty 1

## 2019-01-03 MED ORDER — INSULIN ASPART 100 UNIT/ML ~~LOC~~ SOLN: 0.0000 [IU] | Freq: Three times a day (TID) | SUBCUTANEOUS | Status: DC

## 2019-01-03 MED ORDER — OXYCODONE-ACETAMINOPHEN 5-325 MG PO TABS
1.0000 | ORAL_TABLET | ORAL | Status: DC | PRN
  Administered 2019-01-03 – 2019-01-04 (×5): 2 via ORAL
  Filled 2019-01-03 (×5): qty 2

## 2019-01-03 MED ORDER — SODIUM CHLORIDE 0.9 % IV BOLUS
1000.0000 mL | Freq: Once | INTRAVENOUS | Status: AC
  Administered 2019-01-03: 1000 mL via INTRAVENOUS

## 2019-01-03 NOTE — Plan of Care (Signed)
  Problem: Pain Managment: Goal: General experience of comfort will improve Outcome: Progressing   Problem: Pain Managment: Goal: General experience of comfort will improve Outcome: Progressing   

## 2019-01-03 NOTE — ED Provider Notes (Addendum)
[facility] Emergency Department Provider Note  ____________________________________________   First MD Initiated Contact with Patient 01/02/19 2358     (approximate)  I have reviewed the triage vital signs and the nursing notes.   HISTORY  Chief Complaint Abdominal Pain and Flank Pain    HPI Patricia Gonzalez is a 38 y.o. female with medical history as listed below who presents for evaluation of acute onset generalized abdominal pain with associated nausea and at least one episode of vomiting.  She reports that she was at work and feeling essentially normal when she started having abdominal pain at about 4:00 PM.  The pain is waxed and waned and usually feels like a persistent aching pain but occasionally has episodes of severe sharp stabbing pain.  The nausea has been persistent.  She has had no dysuria and no hematuria.  She has no history of kidney stones.  She denies fever/chills, chest pain, shortness of breath, and any respiratory symptoms or viral symptoms.  She reports that the pain is all over her abdomen but primarily in the lower abdomen and radiating through to her back.  Nothing in particular makes it better and she cannot find a position of comfort.  No diarrhea and no constipation.  Past Medical History:  Diagnosis Date  . Diabetes mellitus without complication ([place])   . GERD (gastroesophageal reflux disease)   . Headache   . Hypertension   . Sleep apnea    uses CPAP    Patient Active Problem List   Diagnosis Date Noted  . Abdominal pain 01/03/2019  . Acute pyelonephritis 01/03/2019  . Acute renal failure (ARF) ([place]) 01/03/2019  . Rectal bleeding   . Intractable chronic migraine without aura and without status migrainosus 01/18/2017  . Morbid obesity ([place]) 01/02/2017  . Obstructive sleep apnea syndrome 07/11/2016  . Diabetes mellitus ([place]) 05/09/2016  . Hypertensive disorder 05/09/2016  . 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 CHOLECYSTECTOMY    . COLONOSCOPY    . COLONOSCOPY WITH PROPOFOL N/A 12/09/2018   Procedure: COLONOSCOPY WITH PROPOFOL;  Surgeon: Lin Landsman, MD;  Location: [facility] ENDOSCOPY;  Service: Gastroenterology;  Laterality: N/A;  . CYST EXCISION    . LAPAROSCOPIC GASTRIC SLEEVE RESECTION WITH HIATAL HERNIA REPAIR  01/02/2017   Procedure: LAPAROSCOPIC GASTRIC SLEEVE RESECTION WITH HIATAL HERNIA REPAIR;  Surgeon: Bonner Puna, MD;  Location: [facility] ORS;  Service: General;;    Prior to Admission medications   Medication Sig Start Date End Date Taking? Authorizing Provider  butalbital-acetaminophen-caffeine (FIORICET, ESGIC) 50-325-40 MG tablet Take 1-2 tablets by mouth every 4 (four) hours as needed for headache.   Yes [provider]  cyclobenzaprine (FLEXERIL) 10 MG tablet Take 10 mg by mouth every 8 (eight) hours as needed for muscle spasms.   Yes [provider]  desogestrel-ethinyl estradiol (RECLIPSEN) 0.15-30 MG-MCG tablet Take 1 tablet by mouth daily.   Yes [provider]  dexlansoprazole (DEXILANT) 60 MG capsule Take 60 mg by mouth daily.   Yes [provider]  ibuprofen (ADVIL,MOTRIN) 600 MG tablet Take 1 tablet (600 mg total) by mouth every 6 (six) hours as needed. Patient taking differently: Take 800 mg by mouth every 6 (six) hours as needed.  05/05/17  Yes Melynda Ripple, MD  levocetirizine (XYZAL) 5 MG tablet Take 5 mg by mouth every morning.   Yes [provider]  lisinopril-hydrochlorothiazide (PRINZIDE,ZESTORETIC) 20-25 MG tablet  Take 1 tablet by mouth daily.   Yes [provider]  ondansetron (ZOFRAN ODT) 4 MG disintegrating tablet Take 1 tablet (4 mg total) by mouth every 4 (four) hours as needed for nausea or vomiting. 01/02/17  Yes Mardelle Matte, PA-C  PREVIDENT 5000 SENSITIVE 1.1-5 % PSTE BRUSH WITH PREVIDENT 2 TIMES A DAY 10/11/18  Yes [provider]  propranolol (INDERAL) 60 MG tablet Take 60 mg by mouth every morning.   Yes [provider]  rizatriptan (MAXALT-MLT) 10 MG disintegrating tablet rizatriptan 10 mg disintegrating tablet  take 1 tablet by mouth ONCE AS NEEDED FOR MIGRAINE FOR UP TO 1 DO...  (REFER TO PRESCRIPTION NOTES). 01/08/17  Yes [provider]  SUMAtriptan (IMITREX) 100 MG tablet Take 100 mg by mouth every 2 (two) hours as needed for migraine. May repeat in 2 hours if headache persists or recurs.   Yes [provider]  topiramate (TOPAMAX) 25 MG tablet Take 25 mg by mouth 2 (two) times daily. 09/24/18  Yes [provider]  valACYclovir (VALTREX) 500 MG tablet Take 2,000 mg by mouth 2 (two) times daily as needed.   Yes [provider]  zolpidem (AMBIEN) 10 MG tablet TK 1 T PO IMMEDIATELY BEFORE BEDTIME FOR SLEEP 09/19/18  Yes [provider]  acyclovir (ZOVIRAX) 400 MG tablet acyclovir 400 mg tablet  take 1 tablet by mouth three times a day for 5 days if needed FLARE    [provider]  atenolol (TENORMIN) 100 MG tablet atenolol 100 mg tablet    [provider]  butalbital-acetaminophen-caffeine (FIORICET WITH CODEINE) 50-325-40-30 MG capsule Take 1-2 capsules by mouth every 6 (six) hours as needed. Max 6 capsule per day    [provider]  cetirizine (ZYRTEC) 10 MG tablet Take by mouth.    [provider]  chlorpheniramine-HYDROcodone (TUSSIONEX) 10-8 MG/5ML SUER hydrocodone 10 mg-chlorpheniramine 8 mg/5 mL oral susp extend.rel 12hr  take 5 milliliters by mouth every 12 hours if needed for cough    [provider]  desogestrel-ethinyl estradiol (APRI,EMOQUETTE,SOLIA) 0.15-30 MG-MCG tablet Take 1 tablet by mouth daily.    [provider]  enoxaparin (LOVENOX) 40 MG/0.4ML injection Inject 0.4 mLs (40 mg total) into the skin daily. Patient not taking: Reported on 10/25/2018 01/02/17   Mardelle Matte, PA-C    HYDROcodone-acetaminophen (HYCET) 7.5-325 mg/15 ml solution Hycet 7.5 mg-325 mg/15 mL oral solution  Take 15 mL every 4 hours by oral route.    [provider]  HYDROcodone-acetaminophen (NORCO/VICODIN) 5-325 MG tablet TK 1 T PO Q 6 H PRN P 09/07/18   [provider]  Influenza vac split quadrivalent PF (FLUARIX) 0.5 ML injection Fluarix Quad 2017-2018 (PF) 60 mcg (15 mcg x 4)/0.5 mL IM syringe  inject 0.5 milliliter intramuscularly    [provider]  metFORMIN (GLUCOPHAGE-XR) 500 MG 24 hr tablet metformin ER 500 mg tablet,extended release 24 hr 01/19/16   [provider]  mupirocin ointment (BACTROBAN) 2 % Apply 1 application topically 3 (three) times daily. 05/05/17   Melynda Ripple, MD  omeprazole (PRILOSEC) 40 MG capsule omeprazole 40 mg capsule,delayed release    [provider]  Oxycodone HCl 10 MG TABS Take by mouth. 03/25/14   [provider]  Polyethylene Glycol 3350 (PEG 3350) POWD Take by mouth. 03/25/14   [provider]  predniSONE (STERAPRED UNI-PAK 21 TAB) 10 MG (21) TBPK tablet Dispense one 6 day pack. Take as directed with food. Patient not  taking: Reported on 01/03/2019 05/05/17   Melynda Ripple, MD  propranolol (INDERAL) 40 MG tablet Take by mouth.    [provider]  silver sulfADIAZINE (SILVADENE) 1 % cream Apply 1 application topically daily. Patient not taking: Reported on 01/03/2019 05/05/17   Melynda Ripple, MD  tretinoin (RETIN-A) 0.025 % cream tretinoin 0.025 % topical cream    [provider]  verapamil (CALAN-SR) 180 MG CR tablet verapamil ER (SR) 180 mg tablet,extended release    [provider]    Allergies Ketorolac tromethamine and Morphine  Family History  Problem Relation Age of Onset  . CAD Father   . Lung cancer Father     Social History Social History   Tobacco Use  . Smoking status: Former Smoker    Packs/day: 0.50    Years: 15.00    Pack years: 7.50     Types: Cigarettes    Last attempt to quit: 12/27/2015    Years since quitting: 3.0  . Smokeless tobacco: Never Used  . Tobacco comment: CURRENTLY VAPES  Substance Use Topics  . Alcohol use: No  . Drug use: No    Review of Systems Constitutional: No fever/chills Eyes: No visual changes. ENT: No sore throat. Cardiovascular: Denies chest pain. Respiratory: Denies shortness of breath. Gastrointestinal: Abdominal pain with nausea and vomiting as described below.  No diarrhea. Genitourinary: Negative for dysuria.  Negative for hematuria. Musculoskeletal: Negative for neck pain.  Negative for back pain. Integumentary: Negative for rash. Neurological: Negative for headaches, focal weakness or numbness.   ____________________________________________   PHYSICAL EXAM:  VITAL SIGNS: ED Triage Vitals  Enc Vitals Group     BP 01/02/19 2246 140/80     Pulse Rate 01/02/19 2246 80     Resp 01/02/19 2246 16     Temp 01/02/19 2246 (!) 97.4 F (36.3 C)     Temp Source 01/02/19 2246 Oral     SpO2 01/02/19 2246 100 %     Weight 01/02/19 2248 76.2 kg (168 lb)     Height 01/02/19 2248 1.626 m (5\' 4" )     Head Circumference --      Peak Flow --      Pain Score 01/02/19 2248 8     Pain Loc --      Pain Edu? --      Excl. in [place]? --     Constitutional: Alert and oriented. Well appearing and in no acute distress although she does appear uncomfortable. Eyes: Conjunctivae are normal.  Head: Atraumatic. Nose: No congestion/rhinnorhea. Mouth/Throat: Mucous membranes are moist. Neck: No stridor.  No meningeal signs.   Cardiovascular: Normal rate, regular rhythm. Good peripheral circulation. Grossly normal heart sounds. Respiratory: Normal respiratory effort.  No retractions. Lungs CTAB. Gastrointestinal: Obese.  Soft and nondistended.  Diffuse tenderness throughout the abdomen particularly in the lower abdomen, but no rebound and no guarding. Musculoskeletal: No lower extremity tenderness nor  edema. No gross deformities of extremities. Neurologic:  Normal speech and language. No gross focal neurologic deficits are appreciated.  Skin:  Skin is warm, dry and intact. No rash noted. Psychiatric: Mood and affect are normal. Speech and behavior are normal.  ____________________________________________   LABS (all labs ordered are listed, but only abnormal results are displayed)  Labs Reviewed  COMPREHENSIVE METABOLIC PANEL - Abnormal; Notable for the following components:      Result Value   Potassium 3.0 (*)    Glucose, Bld 124 (*)    BUN 22 (*)  Creatinine, Ser 1.39 (*)    Calcium 8.6 (*)    GFR calc non Af Amer 48 (*)    GFR calc Af Amer 55 (*)    All other components within normal limits  CBC - Abnormal; Notable for the following components:   WBC 11.6 (*)    All other components within normal limits  URINALYSIS, COMPLETE (UACMP) WITH MICROSCOPIC - Abnormal; Notable for the following components:   Color, Urine STRAW (*)    APPearance CLEAR (*)    Hgb urine dipstick SMALL (*)    Protein, ur 30 (*)    Bacteria, UA RARE (*)    All other components within normal limits  URINE CULTURE  LIPASE, BLOOD  PROTEIN / CREATININE RATIO, URINE  POCT PREGNANCY, URINE   ____________________________________________  EKG  None - EKG not ordered by ED physician ____________________________________________  RADIOLOGY   ED MD interpretation: Bilateral perinephric edema and stranding consistent with acute kidney injury versus pyelonephritis.  No obstructive uropathy.  Left dermoid cyst.  Official radiology report(s): Ct Abdomen Pelvis W Contrast  Result Date: 01/03/2019 CLINICAL DATA:  38 y/o F; generalized abdominal pain and flank pain. EXAM: CT ABDOMEN AND PELVIS WITH CONTRAST TECHNIQUE: Multidetector CT imaging of the abdomen and pelvis was performed using the standard protocol following bolus administration of intravenous contrast. CONTRAST:  112mL ISOVUE-300 IOPAMIDOL  (ISOVUE-300) INJECTION 61% COMPARISON:  None. FINDINGS: Lower chest: No acute abnormality. Hepatobiliary: No focal liver abnormality is seen. Status post cholecystectomy. No biliary dilatation. Pancreas: Unremarkable. No pancreatic ductal dilatation or surrounding inflammatory changes. Spleen: Normal in size without focal abnormality. Adrenals/Urinary Tract: Adrenal glands are unremarkable. Diffuse kidney edema and perinephric stranding. No urinary stone disease or hydronephrosis. Normal bladder. Stomach/Bowel: Stomach is within normal limits. Appendix appears normal. No evidence of bowel wall thickening, distention, or inflammatory changes. Vascular/Lymphatic: Aortic atherosclerosis. No enlarged abdominal or pelvic lymph nodes. Reproductive: Left ovarian 23 mm nodule measuring -64 HU compatible with dermoid (series 5, image 53. Normal appearance of ureter and right adnexa. Other: No abdominal wall hernia or abnormality. No abdominopelvic ascites. Musculoskeletal: No acute or significant osseous findings. IMPRESSION: 1. Diffuse kidney edema and perinephric stranding, possibly pyelonephritis or acute kidney injury. No obstructive uropathy. 2. 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 Electronically Signed   By: Kristine Garbe M.D.   On: 01/03/2019 00:59    ____________________________________________   PROCEDURES  Critical Care performed: No   Procedure(s) performed:   Procedures   ____________________________________________   INITIAL IMPRESSION / ASSESSMENT AND PLAN / ED COURSE  As part of my medical decision making, I reviewed the following data within the [place] notes reviewed and incorporated, Labs reviewed , Old chart reviewed, Discussed with nephrology (Dr. Candiss Norse), Discussed with admitting physician (Dr. Anselm Jungling) , A consult was requested from this/these consultant(s) Nephrology, Notes from prior ED visits and [place] Controlled Substance Database    Differential  diagnosis includes, but is not limited to, renal colic/ureteral stone, diverticulitis, appendicitis, musculoskeletal pain, less likely ovarian torsion or ovarian pain in general.  The patient does not have menstrual cycle secondary to birth control and has had no recent vaginal bleeding and no vaginal complaints.  The nature of her pain is most suggestive of renal colic although an intra-abdominal infection such as diverticulitis or appendicitis is also possible.  I am treating her with morphine 4 mg IV and she has had 2 doses of Zofran 4 mg IV.  Her vital signs are stable and she is  afebrile.  Her lab work is notable for a comprehensive metabolic panel with a slightly elevated creatinine and a decreased potassium of 3.0, negative urine pregnancy, a urinalysis that demonstrates some hemoglobin in a few white cells with rare bacteria (urine culture ordered), normal lipase, and a very mild leukocytosis of about 11.6.  I will reassess after CT scan of the abdomen and pelvis with IV contrast.  Clinical Course as of Jan 03 231  Fri Jan 03, 2019  0118 Diffuse edema of both kidneys with some perinephric stranding but no obstructive uropathy.  Although the radiologist mention the possibility of pyelonephritis, I find it unlikely based on her vital signs, minimal white blood cell elevation.  I have paged Dr. Candiss Norse with nephrology to discuss the findings including the possibility of nephrotic/nephritic syndrome.  CT ABDOMEN PELVIS W CONTRAST [CF]  0124 The patient reports persistent severe pain.  She is getting Dilaudid 1 mg IV.  Awaiting the opportunity to discuss with Dr. Candiss Norse   [CF]  [phone] I spoke with phone with Dr. Candiss Norse who agreed that this seems to represent acute kidney injury of unclear origin, nephrotic syndrome versus pyelonephritis.  He agreed with my plan for ceftriaxone 1 g IV, IV hydration, and he also recommended that we check a protein to creatinine ratio which I have ordered.  I have now also spoken  with Dr. Anselm Jungling with the hospitalist service who will admit.  The patient understands and agrees with the plan.   [CF]  0230 Of note, it is important to recognize that the dermoid cyst seen on the left ovary is only about 2.3 cm in diameter and it would be very unusual and improbable that a cyst of the side, which appears chronic, would result and torsion.  I think it is much more likely her persistent pain is coming from the abnormalities visualized around and including the kidneys.   [CF]    Clinical Course User Index [CF] Hinda Kehr, MD    ____________________________________________  FINAL CLINICAL IMPRESSION(S) / ED DIAGNOSES  Final diagnoses:  Acute kidney injury ([place])  Intractable pain  Proteinuria, unspecified type  Urinary tract infection without hematuria, site unspecified  Dermoid cyst of left ovary     MEDICATIONS GIVEN DURING THIS VISIT:  Medications  cefTRIAXone (ROCEPHIN) 1 g in sodium chloride 0.9 % 100 mL IVPB (has no administration in time range)  0.9 %  sodium chloride infusion (has no administration in time range)  ondansetron (ZOFRAN) injection 4 mg (has no administration in time range)  insulin aspart (novoLOG) injection 0-9 Units (has no administration in time range)  oxyCODONE-acetaminophen (PERCOCET/ROXICET) 5-325 MG per tablet 1-2 tablet (has no administration in time range)  HYDROmorphone (DILAUDID) injection 1 mg (has no administration in time range)  ondansetron (ZOFRAN-ODT) disintegrating tablet 4 mg (4 mg Oral Given 01/02/19 2252)  sodium chloride 0.9 % bolus 1,000 mL (0 mLs Intravenous Stopped 01/03/19 0207)  morphine 4 MG/ML injection 4 mg (4 mg Intravenous Given 01/03/19 0047)  ondansetron (ZOFRAN) injection 4 mg (4 mg Intravenous Given 01/03/19 0048)  iopamidol (ISOVUE-300) 61 % injection 100 mL (100 mLs Intravenous Contrast Given 01/03/19 0038)  HYDROmorphone (DILAUDID) injection 1 mg (1 mg Intravenous Given 01/03/19 0122)  cefTRIAXone  (ROCEPHIN) 1 g in sodium chloride 0.9 % 100 mL IVPB (0 g Intravenous Stopped 01/03/19 0219)     ED Discharge Orders    None       Note:  This document was prepared using Dragon voice recognition software  and may include unintentional dictation errors.   Hinda Kehr, MD 01/03/19 [id number]    Hinda Kehr, MD 01/03/19 [place]    Hinda Kehr, MD 01/03/19 [phone]

## 2019-01-03 NOTE — H&P (Signed)
[facility] [place] NAME: Patricia Gonzalez    MR#:  [id number]  DATE OF BIRTH:  10/17/1981  DATE OF ADMISSION:  01/02/2019  PRIMARY CARE PHYSICIAN: Donnie Coffin, MD   REQUESTING/REFERRING PHYSICIAN: Purcell Nails  CHIEF COMPLAINT:   Chief Complaint  Patient presents with  . Abdominal Pain  . Flank Pain    HISTORY OF PRESENT ILLNESS: Patricia Gonzalez  is a 38 y.o. female with a known history of diabetes, gastroesophageal reflux disease, hypertension, sleep apnea-started having abdominal and back pain around 5 PM yesterday.  She had some nausea but no vomiting.  Denies any fever or chills.  She has increased frequency of urination but very little amount of urine comes out each time. Concerned with this she came to emergency room.  Noted to have acute worsening of the renal function and CT scan showed some stranding around kidneys. ER physician discussed the case with nephrologist and he suggested to admit with antibiotic and IV fluids.  PAST MEDICAL HISTORY:   Past Medical History:  Diagnosis Date  . Diabetes mellitus without complication ([place])   . GERD (gastroesophageal reflux disease)   . Headache   . Hypertension   . Sleep apnea    uses CPAP    PAST SURGICAL HISTORY:  Past Surgical History:  Procedure Laterality Date  . CHOLECYSTECTOMY    . COLONOSCOPY    . COLONOSCOPY WITH PROPOFOL N/A 12/09/2018   Procedure: COLONOSCOPY WITH PROPOFOL;  Surgeon: Lin Landsman, MD;  Location: [facility] ENDOSCOPY;  Service: Gastroenterology;  Laterality: N/A;  . CYST EXCISION    . LAPAROSCOPIC GASTRIC SLEEVE RESECTION WITH HIATAL HERNIA REPAIR  01/02/2017   Procedure: LAPAROSCOPIC GASTRIC SLEEVE RESECTION WITH HIATAL HERNIA REPAIR;  Surgeon: Bonner Puna, MD;  Location: [facility] ORS;  Service: General;;    SOCIAL HISTORY:  Social History   Tobacco Use  . Smoking status: Former Smoker    Packs/day: 0.50    Years: 15.00    Pack years: 7.50    Types:  Cigarettes    Last attempt to quit: 12/27/2015    Years since quitting: 3.0  . Smokeless tobacco: Never Used  . Tobacco comment: CURRENTLY VAPES  Substance Use Topics  . Alcohol use: No    FAMILY HISTORY:  Family History  Problem Relation Age of Onset  . CAD Father   . Lung cancer Father     DRUG ALLERGIES:  Allergies  Allergen Reactions  . Ketorolac Tromethamine Anaphylaxis    Shots  . Morphine Other (See Comments)    Caused stomach pain    REVIEW OF SYSTEMS:   CONSTITUTIONAL: No fever, fatigue or weakness.  EYES: No blurred or double vision.  EARS, NOSE, AND THROAT: No tinnitus or ear pain.  RESPIRATORY: No cough, shortness of breath, wheezing or hemoptysis.  CARDIOVASCULAR: No chest pain, orthopnea, edema.  GASTROINTESTINAL: Have nausea, no vomiting, diarrhea no abdominal pain.  GENITOURINARY: No dysuria, hematuria.  Have increased frequency. ENDOCRINE: No polyuria, nocturia,  HEMATOLOGY: No anemia, easy bruising or bleeding SKIN: No rash or lesion. MUSCULOSKELETAL: No joint pain or arthritis.   NEUROLOGIC: No tingling, numbness, weakness.  PSYCHIATRY: No anxiety or depression.   MEDICATIONS AT HOME:  Prior to Admission medications   Medication Sig Start Date End Date Taking? Authorizing Provider  acyclovir (ZOVIRAX) 400 MG tablet acyclovir 400 mg tablet  take 1 tablet by mouth three times a day for 5 days if needed FLARE    [provider]  atenolol (TENORMIN) 100 MG tablet atenolol 100 mg tablet    [provider]  butalbital-acetaminophen-caffeine (FIORICET WITH CODEINE) 50-325-40-30 MG capsule Take by mouth.    [provider]  butalbital-acetaminophen-caffeine (FIORICET, ESGIC) 50-325-40 MG tablet Take 1-2 tablets by mouth every 4 (four) hours as needed for headache.    [provider]  cetirizine (ZYRTEC) 10 MG tablet Take by mouth.    [provider]  chlorpheniramine-HYDROcodone (TUSSIONEX) 10-8 MG/5ML SUER  hydrocodone 10 mg-chlorpheniramine 8 mg/5 mL oral susp extend.rel 12hr  take 5 milliliters by mouth every 12 hours if needed for cough    [provider]  cyclobenzaprine (FLEXERIL) 10 MG tablet Take 10 mg by mouth every 8 (eight) hours as needed for muscle spasms.    [provider]  desogestrel-ethinyl estradiol (APRI,EMOQUETTE,SOLIA) 0.15-30 MG-MCG tablet Take 1 tablet by mouth daily.    [provider]  dexlansoprazole (DEXILANT) 60 MG capsule Take 60 mg by mouth daily.    [provider]  enoxaparin (LOVENOX) 40 MG/0.4ML injection Inject 0.4 mLs (40 mg total) into the skin daily. Patient not taking: Reported on 10/25/2018 01/02/17   Mardelle Matte, PA-C  HYDROcodone-acetaminophen (HYCET) 7.5-325 mg/15 ml solution Hycet 7.5 mg-325 mg/15 mL oral solution  Take 15 mL every 4 hours by oral route.    [provider]  HYDROcodone-acetaminophen (NORCO/VICODIN) 5-325 MG tablet TK 1 T PO Q 6 H PRN P 09/07/18   [provider]  ibuprofen (ADVIL,MOTRIN) 600 MG tablet Take 1 tablet (600 mg total) by mouth every 6 (six) hours as needed. 05/05/17   Melynda Ripple, MD  Influenza vac split quadrivalent PF (FLUARIX) 0.5 ML injection Fluarix Quad 2017-2018 (PF) 60 mcg (15 mcg x 4)/0.5 mL IM syringe  inject 0.5 milliliter intramuscularly    [provider]  levocetirizine (XYZAL) 5 MG tablet Take 5 mg by mouth every morning.    [provider]  lisinopril-hydrochlorothiazide (PRINZIDE,ZESTORETIC) 20-25 MG tablet Take 1 tablet by mouth daily.    [provider]  metFORMIN (GLUCOPHAGE-XR) 500 MG 24 hr tablet metformin ER 500 mg tablet,extended release 24 hr 01/19/16   [provider]  mupirocin ointment (BACTROBAN) 2 % Apply 1 application topically 3 (three) times daily. 05/05/17   Melynda Ripple, MD  omeprazole (PRILOSEC) 40 MG capsule omeprazole 40 mg capsule,delayed release    [provider]  ondansetron (ZOFRAN  ODT) 4 MG disintegrating tablet Take 1 tablet (4 mg total) by mouth every 4 (four) hours as needed for nausea or vomiting. 01/02/17   Mardelle Matte, PA-C  Oxycodone HCl 10 MG TABS Take by mouth. 03/25/14   [provider]  Polyethylene Glycol 3350 (PEG 3350) POWD Take by mouth. 03/25/14   [provider]  predniSONE (STERAPRED UNI-PAK 21 TAB) 10 MG (21) TBPK tablet Dispense one 6 day pack. Take as directed with food. 05/05/17   Melynda Ripple, MD  PREVIDENT 5000 SENSITIVE 1.1-5 % PSTE BRUSH WITH PREVIDENT 2 TIMES A DAY 10/11/18   [provider]  propranolol (INDERAL) 40 MG tablet Take by mouth.    [provider]  propranolol (INDERAL) 60 MG tablet Take 60 mg by mouth every morning.    [provider]  rizatriptan (MAXALT-MLT) 10 MG disintegrating tablet rizatriptan 10 mg disintegrating tablet  take 1 tablet by mouth ONCE AS NEEDED FOR MIGRAINE FOR UP TO 1 DO...  (REFER TO PRESCRIPTION NOTES). 01/08/17   [provider]  silver sulfADIAZINE (SILVADENE) 1 %  cream Apply 1 application topically daily. 05/05/17   Melynda Ripple, MD  SUMAtriptan (IMITREX) 100 MG tablet Take 100 mg by mouth every 2 (two) hours as needed for migraine. May repeat in 2 hours if headache persists or recurs.    [provider]  topiramate (TOPAMAX) 25 MG tablet Take 25 mg by mouth 2 (two) times daily. 09/24/18   [provider]  tretinoin (RETIN-A) 0.025 % cream tretinoin 0.025 % topical cream    [provider]  verapamil (CALAN-SR) 180 MG CR tablet verapamil ER (SR) 180 mg tablet,extended release    [provider]  zolpidem (AMBIEN) 10 MG tablet TK 1 T PO IMMEDIATELY BEFORE BEDTIME FOR SLEEP 09/19/18   [provider]      PHYSICAL EXAMINATION:   VITAL SIGNS: Blood pressure 131/67, pulse 75, temperature (!) 97.4 F (36.3 C), temperature source Oral, resp. rate 16, height 5\' 4"  (1.626 m), weight 76.2 kg, SpO2 100  %.  GENERAL:  38 y.o.-year-old patient lying in the bed with no acute distress.  EYES: Pupils equal, round, reactive to light and accommodation. No scleral icterus. Extraocular muscles intact.  HEENT: Head atraumatic, normocephalic. Oropharynx and nasopharynx clear.  NECK:  Supple, no jugular venous distention. No thyroid enlargement, no tenderness.  LUNGS: Normal breath sounds bilaterally, no wheezing, rales,rhonchi or crepitation. No use of accessory muscles of respiration.  CARDIOVASCULAR: S1, S2 normal. No murmurs, rubs, or gallops.  ABDOMEN: Soft, tender, nondistended. Bowel sounds present. No organomegaly or mass.  EXTREMITIES: No pedal edema, cyanosis, or clubbing.  NEUROLOGIC: Cranial nerves II through XII are intact. Muscle strength 5/5 in all extremities. Sensation intact. Gait not checked.  PSYCHIATRIC: The patient is alert and oriented x 3.  SKIN: No obvious rash, lesion, or ulcer.   LABORATORY PANEL:   CBC Recent Labs  Lab 01/02/19 2250  WBC 11.6*  HGB 14.1  HCT 40.2  PLT 210  MCV 94.4  MCH 33.1  MCHC 35.1  RDW 11.8   ------------------------------------------------------------------------------------------------------------------  Chemistries  Recent Labs  Lab 01/02/19 2250  NA 138  K 3.0*  CL 103  CO2 27  GLUCOSE 124*  BUN 22*  CREATININE 1.39*  CALCIUM 8.6*  AST 26  ALT 25  ALKPHOS 57  BILITOT 0.5   ------------------------------------------------------------------------------------------------------------------ estimated creatinine clearance is 54.3 mL/min (A) (by C-G formula based on SCr of 1.39 mg/dL (H)). ------------------------------------------------------------------------------------------------------------------ No results for input(s): TSH, T4TOTAL, T3FREE, THYROIDAB in the last 72 hours.  Invalid input(s): FREET3   Coagulation profile No results for input(s): INR, PROTIME in the last 168  hours. ------------------------------------------------------------------------------------------------------------------- No results for input(s): DDIMER in the last 72 hours. -------------------------------------------------------------------------------------------------------------------  Cardiac Enzymes No results for input(s): CKMB, TROPONINI, MYOGLOBIN in the last 168 hours.  Invalid input(s): CK ------------------------------------------------------------------------------------------------------------------ Invalid input(s): POCBNP  ---------------------------------------------------------------------------------------------------------------  Urinalysis    Component Value Date/Time   COLORURINE STRAW (A) 01/02/2019 2250   APPEARANCEUR CLEAR (A) 01/02/2019 2250   LABSPEC 1.006 01/02/2019 2250   PHURINE 6.0 01/02/2019 2250   GLUCOSEU NEGATIVE 01/02/2019 2250   HGBUR SMALL (A) 01/02/2019 2250   BILIRUBINUR NEGATIVE 01/02/2019 2250   KETONESUR NEGATIVE 01/02/2019 2250   PROTEINUR 30 (A) 01/02/2019 2250   NITRITE NEGATIVE 01/02/2019 2250   LEUKOCYTESUR NEGATIVE 01/02/2019 2250     RADIOLOGY: Ct Abdomen Pelvis W Contrast  Result Date: 01/03/2019 CLINICAL DATA:  38 y/o F; generalized abdominal pain and flank pain. EXAM: CT ABDOMEN AND PELVIS WITH CONTRAST TECHNIQUE: Multidetector CT imaging of the abdomen and pelvis  was performed using the standard protocol following bolus administration of intravenous contrast. CONTRAST:  171mL ISOVUE-300 IOPAMIDOL (ISOVUE-300) INJECTION 61% COMPARISON:  None. FINDINGS: Lower chest: No acute abnormality. Hepatobiliary: No focal liver abnormality is seen. Status post cholecystectomy. No biliary dilatation. Pancreas: Unremarkable. No pancreatic ductal dilatation or surrounding inflammatory changes. Spleen: Normal in size without focal abnormality. Adrenals/Urinary Tract: Adrenal glands are unremarkable. Diffuse kidney edema and perinephric  stranding. No urinary stone disease or hydronephrosis. Normal bladder. Stomach/Bowel: Stomach is within normal limits. Appendix appears normal. No evidence of bowel wall thickening, distention, or inflammatory changes. Vascular/Lymphatic: Aortic atherosclerosis. No enlarged abdominal or pelvic lymph nodes. Reproductive: Left ovarian 23 mm nodule measuring -64 HU compatible with dermoid (series 5, image 53. Normal appearance of ureter and right adnexa. Other: No abdominal wall hernia or abnormality. No abdominopelvic ascites. Musculoskeletal: No acute or significant osseous findings. IMPRESSION: 1. Diffuse kidney edema and perinephric stranding, possibly pyelonephritis or acute kidney injury. No obstructive uropathy. 2. 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 Electronically Signed   By: Kristine Garbe M.D.   On: 01/03/2019 00:59    EKG: No orders found for this or any previous visit.  IMPRESSION AND PLAN:  *Pyelonephritis IV ceftriaxone, follow cultures.  *Acute renal failure IV fluids, nephrology consult is called in by ER physician.  * Htn  Cont home meds.  * Diabetes mellitus   Cont home meds, hold metformin  keep on sliding scale insulin.  All the records are reviewed and case discussed with ED provider. Management plans discussed with the patient, family and they are in agreement.  CODE STATUS: Full. Code Status History    Date Active Date Inactive Code Status Order ID Comments User Context   01/02/2017 1119 01/03/2017 1721 Full Code [id number]  Bonner Puna, MD Inpatient       TOTAL TIME TAKING CARE OF THIS PATIENT: 45 minutes.    Vaughan Basta M.D on 01/03/2019   Between 7am to 6pm - Pager - [phone]  After 6pm go to [email or web address] - password [email or web address] [facility]  Office  [phone]  CC: Primary care physician; Donnie Coffin, MD   Note: This dictation was prepared with Dragon dictation along with smaller phrase technology. Any  transcriptional errors that result from this process are unintentional.

## 2019-01-03 NOTE — ED Notes (Signed)
ED TO INPATIENT HANDOFF REPORT  ED Nurse Name and Phone #:   Name/Age/Gender Patricia Gonzalez 38 y.o. female Room/Bed: ED05A/ED05A  Code Status   Code Status: Prior  Home/SNF/Other  Patient oriented to: self, place, time and situation Is this baseline? Yes   Triage Complete: Triage complete   Chief Complaint Abd/back pain  Triage Note Pt ambulates to triage room in NAD. Pt reports generalized abdominal pain and right/left flank pain that started today at 1730. Pt reports nausea but denies emesis or diarrhea.    Allergies Allergies  Allergen Reactions  . Ketorolac Tromethamine Anaphylaxis    Shots  . Morphine Other (See Comments)    Caused stomach pain    Level of Care/Admitting Diagnosis ED Disposition    ED Disposition Condition [facility]: [place] [[id number]]  Level of Care: Med-Surg [16]  Diagnosis: Acute pyelonephritis [338250]  Admitting Physician: Vaughan Basta [[id number]]  Attending Physician: Vaughan Basta [phone]  Estimated length of stay: past midnight tomorrow  Certification:: I certify this patient will need inpatient services for at least 2 midnights  PT Class (Do Not Modify): Inpatient [101]  PT Acc Code (Do Not Modify): Private [1]       Medical/Surgery History Past Medical History:  Diagnosis Date  . Diabetes mellitus without complication ([place])   . GERD (gastroesophageal reflux disease)   . Headache   . Hypertension   . Sleep apnea    uses CPAP   Past Surgical History:  Procedure Laterality Date  . CHOLECYSTECTOMY    . COLONOSCOPY    . COLONOSCOPY WITH PROPOFOL N/A 12/09/2018   Procedure: COLONOSCOPY WITH PROPOFOL;  Surgeon: Lin Landsman, MD;  [facility] END[facility]COPY;  Service: Gastroenterology;  Laterality: N/A;  . CYST EXCISION    . LAPAROSCOPIC GASTRIC SLEEVE RESECTION WITH HIATAL HERNIA REPAIR  01/02/2017   Procedure: LAPAROSCOPIC GASTRIC SLEEVE RESECTION WITH  HIATAL HERNIA REPAIR;  Surgeon: Bonner Puna, MD;  Location: [facility] ORS;  Service: General;;     IV Location/Drains/Wounds Patient Lines/Drains/Airways Status   Active Line/Drains/Airways    Name:   Placement date:   Placement time:   Site:   Days:   Peripheral IV 01/03/19 Left Forearm   01/03/19    0010    Forearm   less than 1   Incision (Closed) 01/02/17 Abdomen   01/02/17    0907     731   Incision - 5 Ports Abdomen Medial;Upper Right;Medial Right;Lateral Left;Medial Left;Lateral   01/02/17    0909     731          Intake/Output Last 24 hours No intake or output data in the 24 hours ending 01/03/19 0253  Labs/Imaging Results for orders placed or performed during the hospital encounter of 01/02/19 (from the past 48 hour(s))  Lipase, blood     Status: None   Collection Time: 01/02/19 10:50 PM  Result Value Ref Range   Lipase 34 11 - 51 U/L    Comment: Performed at [facility], [address] [place], [place] [address]  Comprehensive metabolic panel     Status: Abnormal   Collection Time: 01/02/19 10:50 PM  Result Value Ref Range   Sodium 138 135 - 145 mmol/L   Potassium 3.0 (L) 3.5 - 5.1 mmol/L   Chloride 103 98 - 111 mmol/L   CO2 27 22 - 32 mmol/L   Glucose, Bld 124 (H) 70 - 99 mg/dL   BUN 22 (  H) 6 - 20 mg/dL   Creatinine, Ser 1.39 (H) 0.44 - 1.00 mg/dL   Calcium 8.6 (L) 8.9 - 10.3 mg/dL   Total Protein 6.9 6.5 - 8.1 g/dL   Albumin 4.2 3.5 - 5.0 g/dL   AST 26 15 - 41 U/L   ALT 25 0 - 44 U/L   Alkaline Phosphatase 57 38 - 126 U/L   Total Bilirubin 0.5 0.3 - 1.2 mg/dL   GFR calc non Af Amer 48 (L) >60 mL/min   GFR calc Af Amer 55 (L) >60 mL/min   Anion gap 8 5 - 15    Comment: Performed at [facility], [address] [place], [place] [address]  CBC     Status: Abnormal   Collection Time: 01/02/19 10:50 PM  Result Value Ref Range   WBC 11.6 (H) 4.0 - 10.5 K/uL   RBC 4.26 3.87 - 5.11 MIL/uL   Hemoglobin 14.1 12.0 - 15.0 g/dL   HCT 40.2 36.0 -  46.0 %   MCV 94.4 80.0 - 100.0 fL   MCH 33.1 26.0 - 34.0 pg   MCHC 35.1 30.0 - 36.0 g/dL   RDW 11.8 11.5 - 15.5 %   Platelets 210 150 - 400 K/uL   nRBC 0.0 0.0 - 0.2 %    Comment: Performed at [facility], [address] [place], [place] [address]  Urinalysis, Complete w Microscopic     Status: Abnormal   Collection Time: 01/02/19 10:50 PM  Result Value Ref Range   Color, Urine STRAW (A) YELLOW   APPearance CLEAR (A) CLEAR   Specific Gravity, Urine 1.006 1.005 - 1.030   pH 6.0 5.0 - 8.0   Glucose, UA NEGATIVE NEGATIVE mg/dL   Hgb urine dipstick SMALL (A) NEGATIVE   Bilirubin Urine NEGATIVE NEGATIVE   Ketones, ur NEGATIVE NEGATIVE mg/dL   Protein, ur 30 (A) NEGATIVE mg/dL   Nitrite NEGATIVE NEGATIVE   Leukocytes, UA NEGATIVE NEGATIVE   RBC / HPF 0-5 0 - 5 RBC/hpf   WBC, UA 6-10 0 - 5 WBC/hpf   Bacteria, UA RARE (A) NONE SEEN   Squamous Epithelial / LPF 0-5 0 - 5    Comment: Performed at [facility], [address] [place], [place] [address]  Pregnancy, urine POC     Status: None   Collection Time: 01/02/19 11:57 PM  Result Value Ref Range   Preg Test, Ur NEGATIVE NEGATIVE    Comment:        THE SENSITIVITY OF THIS METHODOLOGY IS >24 mIU/mL    Ct Abdomen Pelvis W Contrast  Result Date: 01/03/2019 CLINICAL DATA:  38 y/o F; generalized abdominal pain and flank pain. EXAM: CT ABDOMEN AND PELVIS WITH CONTRAST TECHNIQUE: Multidetector CT imaging of the abdomen and pelvis was performed using the standard protocol following bolus administration of intravenous contrast. CONTRAST:  173mL ISOVUE-300 IOPAMIDOL (ISOVUE-300) INJECTION 61% COMPARISON:  None. FINDINGS: Lower chest: No acute abnormality. Hepatobiliary: No focal liver abnormality is seen. Status post cholecystectomy. No biliary dilatation. Pancreas: Unremarkable. No pancreatic ductal dilatation or surrounding inflammatory changes. Spleen: Normal in size without focal abnormality. Adrenals/Urinary Tract:  Adrenal glands are unremarkable. Diffuse kidney edema and perinephric stranding. No urinary stone disease or hydronephrosis. Normal bladder. Stomach/Bowel: Stomach is within normal limits. Appendix appears normal. No evidence of bowel wall thickening, distention, or inflammatory changes. Vascular/Lymphatic: Aortic atherosclerosis. No enlarged abdominal or pelvic lymph nodes. Reproductive: Left ovarian 23 mm nodule measuring -64 HU compatible with dermoid (series  5, image 53. Normal appearance of ureter and right adnexa. Other: No abdominal wall hernia or abnormality. No abdominopelvic ascites. Musculoskeletal: No acute or significant osseous findings. IMPRESSION: 1. Diffuse kidney edema and perinephric stranding, possibly pyelonephritis or acute kidney injury. No obstructive uropathy. 2. 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 Electronically Signed   By: Kristine Garbe M.D.   On: 01/03/2019 00:59    Pending Labs Unresulted Labs (From admission, onward)    Start     Ordered   01/03/19 0128  Protein / creatinine ratio, urine  ONCE - STAT,   STAT     01/03/19 0127   01/03/19 0045  Urine Culture  Add-on,   AD    Question:  Patient immune status  Answer:  Normal   01/03/19 0044   Signed and Held  HIV antibody (Routine Testing)  Once,   R     Signed and Held   Signed and Held  Basic metabolic panel  Tomorrow morning,   R     Signed and Held   Signed and Held  CBC  Tomorrow morning,   R     Signed and Held   Signed and Held  CBC  (heparin)  Once,   R    Comments:  Baseline for heparin therapy IF NOT ALREADY DRAWN.  Notify MD if PLT < 100 K.    Signed and Held   Signed and Held  Creatinine, serum  (heparin)  Once,   R    Comments:  Baseline for heparin therapy IF NOT ALREADY DRAWN.    Signed and Held          Vitals/Pain Today's Vitals   01/03/19 0124 01/03/19 0130 01/03/19 0200 01/03/19 0251  BP:  129/80 131/67 132/65  Pulse:  80 75 77  Resp:      Temp:      TempSrc:      SpO2:  100%  100% 100%  Weight:      Height:      PainSc: 9        Isolation Precautions No active isolations  Medications Medications  cefTRIAXone (ROCEPHIN) 1 g in sodium chloride 0.9 % 100 mL IVPB (has no administration in time range)  0.9 %  sodium chloride infusion (has no administration in time range)  ondansetron (ZOFRAN) injection 4 mg (has no administration in time range)  insulin aspart (novoLOG) injection 0-9 Units (has no administration in time range)  oxyCODONE-acetaminophen (PERCOCET/ROXICET) 5-325 MG per tablet 1-2 tablet (2 tablets Oral Given 01/03/19 0244)  HYDROmorphone (DILAUDID) injection 1 mg (has no administration in time range)  fluconazole (DIFLUCAN) tablet 100 mg (has no administration in time range)  oxyCODONE-acetaminophen (PERCOCET/ROXICET) 5-325 MG per tablet 1 tablet (has no administration in time range)  ondansetron (ZOFRAN-ODT) disintegrating tablet 4 mg (4 mg Oral Given 01/02/19 2252)  sodium chloride 0.9 % bolus 1,000 mL (0 mLs Intravenous Stopped 01/03/19 0207)  morphine 4 MG/ML injection 4 mg (4 mg Intravenous Given 01/03/19 0047)  ondansetron (ZOFRAN) injection 4 mg (4 mg Intravenous Given 01/03/19 0048)  iopamidol (ISOVUE-300) 61 % injection 100 mL (100 mLs Intravenous Contrast Given 01/03/19 0038)  HYDROmorphone (DILAUDID) injection 1 mg (1 mg Intravenous Given 01/03/19 0122)  cefTRIAXone (ROCEPHIN) 1 g in sodium chloride 0.9 % 100 mL IVPB (0 g Intravenous Stopped 01/03/19 0219)    Mobility walks Low fall risk   Focused Assessments Renal   Recommendations: See Admitting Provider Note  Report given to:   Additional Notes:

## 2019-01-03 NOTE — Progress Notes (Signed)
.    Admitted this morning for abdominal pain, pyelonephritis, started on IV fluids, IV antibiotics, patient has flank pain.  Better than this morning.  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  Continue IV antibiotics, avoid nephrotoxic agents, 2.acute kidney injury; CT scan did not show renal vein thrombosis.  Urine cultures are pending.  UA is not convincing for any UTI. 3.  Hypokalemia due to HCTZ, lisinopril, hold both of them, replace potassium.  Possible discharge home tomorrow if everything okay and can have follow-up with nephrology for work-up of glomerulonephritis.

## 2019-01-03 NOTE — Progress Notes (Signed)
Initial Nutrition Assessment  DOCUMENTATION CODES:   Not applicable  INTERVENTION:   MVI daily   Liberalize diet   NUTRITION DIAGNOSIS:   Inadequate oral intake related to acute illness as evidenced by meal completion < 50%.  GOAL:   Patient will meet greater than or equal to 90% of their needs  MONITOR:   PO intake, Labs, Weight trends, Skin, I & O's  REASON FOR ASSESSMENT:   Malnutrition Screening Tool    ASSESSMENT:   38 y.o. female with a known history of diabetes, gastroesophageal reflux disease, hypertension, sleep apnea admitted with pyeloneprhitis    Met with pt in room today. Pt reports poor appetite and oral intake for several days pta. Pt reports her appetite is still poor today; pt ate <50% of her breakfast this morning. Pt reports a 10lb intentional weight loss pta. Pt declines oral nutritional supplements today. RD will add MVI and liberalize diet to encourage po intake.   Medications reviewed and include: diflucan, heparin, hydromorphone, insulin, oxycodone, protonix, KCL, NaCl '@75ml'$ /hr, ceftriaxone    Labs reviewed: K 3.0(L), Mg 2.1  NUTRITION - FOCUSED PHYSICAL EXAM:    Most Recent Value  Orbital Region  No depletion  Upper Arm Region  No depletion  Thoracic and Lumbar Region  No depletion  Buccal Region  No depletion  Temple Region  No depletion  Clavicle Bone Region  No depletion  Clavicle and Acromion Bone Region  No depletion  Scapular Bone Region  No depletion  Dorsal Hand  No depletion  Patellar Region  No depletion  Anterior Thigh Region  No depletion  Posterior Calf Region  No depletion  Edema (RD Assessment)  None  Hair  Reviewed  Eyes  Reviewed  Mouth  Reviewed  Skin  Reviewed  Nails  Reviewed     Diet Order:   Diet Order            Diet regular Room service appropriate? Yes; Fluid consistency: Thin  Diet effective now             EDUCATION NEEDS:   Education needs have been addressed  Skin:  Skin Assessment:  Reviewed RN Assessment  Last BM:  1/29  Height:   Ht Readings from Last 1 Encounters:  01/02/19 '5\' 4"'$  (1.626 m)    Weight:   Wt Readings from Last 1 Encounters:  01/02/19 76.2 kg    Ideal Body Weight:  54.5 kg  BMI:  Body mass index is 28.84 kg/m.  Estimated Nutritional Needs:   Kcal:  1600-1900kcal/day   Protein:  76-84g/day   Fluid:  >1.6L/day   Koleen Distance MS, RD, LDN Pager #- [phone] Office#- [phone] After Hours Pager: [phone]

## 2019-01-03 NOTE — Consult Note (Signed)
Date: 01/03/2019                  Patient Name:  Patricia Gonzalez  MRN: [id number]  DOB: 1981/01/27  Age / Sex: 38 y.o., female         PCP: Donnie Coffin, MD                 Service Requesting Consult: IM/ Epifanio Lesches, MD                 Reason for Consult: AKI            History of Present Illness: Patient is a 38 y.o. female with medical problems of diabetes, GERD, sleep apnea, Roux-en-Y gastric bypass, chronically loose stools, perirectal abscess 2015, chronic intermittent painless rectal bleeding status post hemorrhoid band ligation colonoscopywho was admitted [facility] for evaluation of acute onset of generalized abdominal pain.  Patient states that she had sudden onset of acute abdominal pain.  She was nauseous.  No complaints of vomiting or diarrhea.  No fever. Urinalysis in the emergency room showed 0-5 RBCs, 6-10 WBCs.  Urine protein to creatinine ratio is 1.2 g.  Patient underwent a CT scan of the abdomen with IV contrast which showed diffuse kidney edema and perinephric stranding.  Nephrology consult has been requested to evaluate  Medications: Outpatient medications: Medications Prior to Admission  Medication Sig Dispense Refill Last Dose  . butalbital-acetaminophen-caffeine (FIORICET, ESGIC) 50-325-40 MG tablet Take 1-2 tablets by mouth every 4 (four) hours as needed for headache.   prn at prn  . cyclobenzaprine (FLEXERIL) 10 MG tablet Take 10 mg by mouth every 8 (eight) hours as needed for muscle spasms.   prn at prn  . desogestrel-ethinyl estradiol (RECLIPSEN) 0.15-30 MG-MCG tablet Take 1 tablet by mouth daily.   01/02/2019 at Unknown time  . dexlansoprazole (DEXILANT) 60 MG capsule Take 60 mg by mouth daily.   01/02/2019 at Unknown time  . ibuprofen (ADVIL,MOTRIN) 600 MG tablet Take 1 tablet (600 mg total) by mouth every 6 (six) hours as needed. (Patient taking differently: Take 800 mg by mouth every 6 (six) hours as needed. ) 30 tablet 0 prn at prn   . levocetirizine (XYZAL) 5 MG tablet Take 5 mg by mouth every morning.   01/02/2019 at Unknown time  . lisinopril-hydrochlorothiazide (PRINZIDE,ZESTORETIC) 20-25 MG tablet Take 1 tablet by mouth daily.   01/02/2019 at Unknown time  . ondansetron (ZOFRAN ODT) 4 MG disintegrating tablet Take 1 tablet (4 mg total) by mouth every 4 (four) hours as needed for nausea or vomiting. 20 tablet 0 prn at prn  . PREVIDENT 5000 SENSITIVE 1.1-5 % PSTE BRUSH WITH PREVIDENT 2 TIMES A DAY  5 01/02/2019 at Unknown time  . propranolol (INDERAL) 60 MG tablet Take 60 mg by mouth every morning.   01/02/2019 at Unknown time  . rizatriptan (MAXALT-MLT) 10 MG disintegrating tablet rizatriptan 10 mg disintegrating tablet  take 1 tablet by mouth ONCE AS NEEDED FOR MIGRAINE FOR UP TO 1 DO...  (REFER TO PRESCRIPTION NOTES).   prn at prn  . SUMAtriptan (IMITREX) 100 MG tablet Take 100 mg by mouth every 2 (two) hours as needed for migraine. May repeat in 2 hours if headache persists or recurs.   prn at prn  . topiramate (TOPAMAX) 25 MG tablet Take 25 mg by mouth 2 (two) times daily.  1 01/02/2019 at Unknown time  . valACYclovir (VALTREX) 500 MG tablet Take 2,000 mg  by mouth 2 (two) times daily as needed.   prn at prn  . zolpidem (AMBIEN) 10 MG tablet TK 1 T PO IMMEDIATELY BEFORE BEDTIME FOR SLEEP  5 Unknown at Unknown  . acyclovir (ZOVIRAX) 400 MG tablet acyclovir 400 mg tablet  take 1 tablet by mouth three times a day for 5 days if needed FLARE   Not Taking at Unknown time  . atenolol (TENORMIN) 100 MG tablet atenolol 100 mg tablet   Not Taking at Unknown time  . butalbital-acetaminophen-caffeine (FIORICET WITH CODEINE) 50-325-40-30 MG capsule Take 1-2 capsules by mouth every 6 (six) hours as needed. Max 6 capsule per day   Not Taking at prn  . cetirizine (ZYRTEC) 10 MG tablet Take by mouth.   Not Taking at Unknown time  . chlorpheniramine-HYDROcodone (TUSSIONEX) 10-8 MG/5ML SUER hydrocodone 10 mg-chlorpheniramine 8 mg/5 mL oral susp  extend.rel 12hr  take 5 milliliters by mouth every 12 hours if needed for cough   Completed Course at Unknown time  . desogestrel-ethinyl estradiol (APRI,EMOQUETTE,SOLIA) 0.15-30 MG-MCG tablet Take 1 tablet by mouth daily.   12/08/2018 at Unknown time  . enoxaparin (LOVENOX) 40 MG/0.4ML injection Inject 0.4 mLs (40 mg total) into the skin daily. (Patient not taking: Reported on 10/25/2018) 14 Syringe 0 Not Taking at Unknown time  . HYDROcodone-acetaminophen (HYCET) 7.5-325 mg/15 ml solution Hycet 7.5 mg-325 mg/15 mL oral solution  Take 15 mL every 4 hours by oral route.   Completed Course at Unknown time  . HYDROcodone-acetaminophen (NORCO/VICODIN) 5-325 MG tablet TK 1 T PO Q 6 H PRN P  0 Completed Course at Unknown time  . Influenza vac split quadrivalent PF (FLUARIX) 0.5 ML injection Fluarix Quad 2017-2018 (PF) 60 mcg (15 mcg x 4)/0.5 mL IM syringe  inject 0.5 milliliter intramuscularly   Completed Course at Unknown time  . metFORMIN (GLUCOPHAGE-XR) 500 MG 24 hr tablet metformin ER 500 mg tablet,extended release 24 hr   Not Taking at Unknown time  . mupirocin ointment (BACTROBAN) 2 % Apply 1 application topically 3 (three) times daily. 22 g 0 12/08/2018 at Unknown time  . omeprazole (PRILOSEC) 40 MG capsule omeprazole 40 mg capsule,delayed release   Not Taking at Unknown time  . Oxycodone HCl 10 MG TABS Take by mouth.   Completed Course at Unknown time  . Polyethylene Glycol 3350 (PEG 3350) POWD Take by mouth.   Not Taking at Unknown time  . predniSONE (STERAPRED UNI-PAK 21 TAB) 10 MG (21) TBPK tablet Dispense one 6 day pack. Take as directed with food. (Patient not taking: Reported on 01/03/2019) 21 tablet 0 Not Taking at Unknown time  . propranolol (INDERAL) 40 MG tablet Take by mouth.   Not Taking at Unknown time  . silver sulfADIAZINE (SILVADENE) 1 % cream Apply 1 application topically daily. (Patient not taking: Reported on 01/03/2019) 50 g 0 Completed Course at Unknown time  . tretinoin (RETIN-A)  0.025 % cream tretinoin 0.025 % topical cream   Not Taking at Unknown time  . verapamil (CALAN-SR) 180 MG CR tablet verapamil ER (SR) 180 mg tablet,extended release   Not Taking at Unknown time    Current medications: Current Facility-Administered Medications  Medication Dose Route Frequency Provider Last Rate Last Dose  . 0.9 %  sodium chloride infusion   Intravenous Continuous Vaughan Basta, MD 75 mL/hr at 01/03/19 0412    . 0.9 %  sodium chloride infusion   Intravenous Continuous Vaughan Basta, MD      . butalbital-acetaminophen-caffeine (FIORICET, ESGIC)  50-325-40 MG per tablet 1-2 tablet  1-2 tablet Oral Q4H PRN Vaughan Basta, MD      . Derrill Memo ON 01/04/2019] cefTRIAXone (ROCEPHIN) 1 g in sodium chloride 0.9 % 100 mL IVPB  1 g Intravenous Q24H Vaughan Basta, MD      . docusate sodium (COLACE) capsule 100 mg  100 mg Oral BID PRN Vaughan Basta, MD      . fluconazole (DIFLUCAN) tablet 100 mg  100 mg Oral Daily Vaughan Basta, MD      . heparin injection 5,000 Units  5,000 Units Subcutaneous Q8H Vaughan Basta, MD   5,000 Units at 01/03/19 0600  . HYDROmorphone (DILAUDID) injection 1 mg  1 mg Intravenous STAT Hinda Kehr, MD      . insulin aspart (novoLOG) injection 0-9 Units  0-9 Units Subcutaneous TID WC Vaughan Basta, MD      . ondansetron (ZOFRAN) injection 4 mg  4 mg Intravenous Q6H PRN Vaughan Basta, MD      . oxyCODONE-acetaminophen (PERCOCET/ROXICET) 5-325 MG per tablet 1 tablet  1 tablet Oral Once Vaughan Basta, MD      . oxyCODONE-acetaminophen (PERCOCET/ROXICET) 5-325 MG per tablet 1-2 tablet  1-2 tablet Oral Q4H PRN Vaughan Basta, MD   2 tablet at 01/03/19 0805  . pantoprazole (PROTONIX) EC tablet 40 mg  40 mg Oral Daily Vaughan Basta, MD   40 mg at 01/03/19 0805  . SUMAtriptan (IMITREX) tablet 100 mg  100 mg Oral Q2H PRN Vaughan Basta, MD           Allergies: Allergies  Allergen Reactions  . Ketorolac Tromethamine Anaphylaxis    Shots  . Morphine Other (See Comments)    Caused stomach pain      Past Medical History: Past Medical History:  Diagnosis Date  . GERD (gastroesophageal reflux disease)   . Headache   . Hypertension      Past Surgical History: Past Surgical History:  Procedure Laterality Date  . CHOLECYSTECTOMY    . COLONOSCOPY    . COLONOSCOPY WITH PROPOFOL N/A 12/09/2018   Procedure: COLONOSCOPY WITH PROPOFOL;  Surgeon: Lin Landsman, MD;  Location: [facility] ENDOSCOPY;  Service: Gastroenterology;  Laterality: N/A;  . CYST EXCISION    . LAPAROSCOPIC GASTRIC SLEEVE RESECTION WITH HIATAL HERNIA REPAIR  01/02/2017   Procedure: LAPAROSCOPIC GASTRIC SLEEVE RESECTION WITH HIATAL HERNIA REPAIR;  Surgeon: Bonner Puna, MD;  Location: [facility] ORS;  Service: General;;     Family History: Family History  Problem Relation Age of Onset  . CAD Father   . Lung cancer Father      Social History: Social History   Socioeconomic History  . Marital status: Married    Spouse name: Not on file  . Number of children: Not on file  . Years of education: Not on file  . Highest education level: Not on file  Occupational History  . Not on file  Social Needs  . Financial resource strain: Not on file  . Food insecurity:    Worry: Not on file    Inability: Not on file  . Transportation needs:    Medical: Not on file    Non-medical: Not on file  Tobacco Use  . Smoking status: Former Smoker    Packs/day: 0.50    Years: 15.00    Pack years: 7.50    Types: Cigarettes    Last attempt to quit: 12/27/2015    Years since quitting: 3.0  . Smokeless tobacco: Never Used  . Tobacco comment:  CURRENTLY VAPES  Substance and Sexual Activity  . Alcohol use: No  . Drug use: No  . Sexual activity: Not on file  Lifestyle  . Physical activity:    Days per week: Not on file    Minutes per session: Not on file  . Stress: Not on  file  Relationships  . Social connections:    Talks on phone: Not on file    Gets together: Not on file    Attends religious service: Not on file    Active member of club or organization: Not on file    Attends meetings of clubs or organizations: Not on file    Relationship status: Not on file  . Intimate partner violence:    Fear of current or ex partner: Not on file    Emotionally abused: Not on file    Physically abused: Not on file    Forced sexual activity: Not on file  Other Topics Concern  . Not on file  Social History Narrative  . Not on file     Review of Systems: Gen: No fevers or chills HEENT: No vision or hearing problems CV: No chest pain or shortness of breath Resp: No cough or sputum production GI: No vomiting or diarrhea GU : No problems with voiding.  No episodes of gross hematuria MS: No joint pains Derm:  No complaints of skin rashes Psych: No complaints Heme: No complaints Neuro: No complaint Endocrine no complaint  Vital Signs: Blood pressure 104/66, pulse 84, temperature 97.7 F (36.5 C), temperature source Oral, resp. rate 18, height 5\' 4"  (1.626 m), weight 76.2 kg, SpO2 100 %.  No intake or output data in the 24 hours ending 01/03/19 0933  Weight trends: Filed Weights   01/02/19 2248  Weight: 76.2 kg    Physical Exam: General:  Well-appearing, laying in the bed  HEENT  anicteric, moist oral mucous membranes  Neck:  Supple, no masses, no bruits  Lungs:  Normal breathing effort on room air, clear to auscultation  Heart::  Regular rhythm, no rub  Abdomen:  Soft, mild diffuse tenderness.  No guarding   Extremities:  No peripheral edema  Neurologic:  Alert, oriented  Skin:  No acute rashes, multiple tattoos    Lab results: Basic Metabolic Panel: Recent Labs  Lab 01/02/19 2250 01/03/19 0525  NA 138 140  K 3.0* 3.0*  CL 103 106  CO2 27 27  GLUCOSE 124* 148*  BUN 22* 22*  CREATININE 1.39* 1.59*  CALCIUM 8.6* 8.1*    Liver  Function Tests: Recent Labs  Lab 01/02/19 2250  AST 26  ALT 25  ALKPHOS 57  BILITOT 0.5  PROT 6.9  ALBUMIN 4.2   Recent Labs  Lab 01/02/19 2250  LIPASE 34   No results for input(s): AMMONIA in the last 168 hours.  CBC: Recent Labs  Lab 01/02/19 2250 01/03/19 0525  WBC 11.6* 7.6  HGB 14.1 12.9  HCT 40.2 36.9  MCV 94.4 95.1  PLT 210 187    Cardiac Enzymes: No results for input(s): CKTOTAL, TROPONINI in the last 168 hours.  BNP: Invalid input(s): POCBNP  CBG: Recent Labs  Lab 01/03/19 9509  TOIZTI 45    Microbiology: No results found for this or any previous visit (from the past 720 hour(s)).   Coagulation Studies: No results for input(s): LABPROT, INR in the last 72 hours.  Urinalysis: Recent Labs    01/02/19 2250  COLORURINE STRAW*  LABSPEC 1.006  PHURINE 6.0  GLUCOSEU NEGATIVE  HGBUR SMALL*  BILIRUBINUR NEGATIVE  KETONESUR NEGATIVE  PROTEINUR 30*  NITRITE NEGATIVE  LEUKOCYTESUR NEGATIVE        Imaging: Ct Abdomen Pelvis W Contrast  Result Date: 01/03/2019 CLINICAL DATA:  38 y/o F; generalized abdominal pain and flank pain. EXAM: CT ABDOMEN AND PELVIS WITH CONTRAST TECHNIQUE: Multidetector CT imaging of the abdomen and pelvis was performed using the standard protocol following bolus administration of intravenous contrast. CONTRAST:  164mL ISOVUE-300 IOPAMIDOL (ISOVUE-300) INJECTION 61% COMPARISON:  None. FINDINGS: Lower chest: No acute abnormality. Hepatobiliary: No focal liver abnormality is seen. Status post cholecystectomy. No biliary dilatation. Pancreas: Unremarkable. No pancreatic ductal dilatation or surrounding inflammatory changes. Spleen: Normal in size without focal abnormality. Adrenals/Urinary Tract: Adrenal glands are unremarkable. Diffuse kidney edema and perinephric stranding. No urinary stone disease or hydronephrosis. Normal bladder. Stomach/Bowel: Stomach is within normal limits. Appendix appears normal. No evidence of bowel  wall thickening, distention, or inflammatory changes. Vascular/Lymphatic: Aortic atherosclerosis. No enlarged abdominal or pelvic lymph nodes. Reproductive: Left ovarian 23 mm nodule measuring -64 HU compatible with dermoid (series 5, image 53. Normal appearance of ureter and right adnexa. Other: No abdominal wall hernia or abnormality. No abdominopelvic ascites. Musculoskeletal: No acute or significant osseous findings. IMPRESSION: 1. Diffuse kidney edema and perinephric stranding, possibly pyelonephritis or acute kidney injury. No obstructive uropathy. 2. 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 Electronically Signed   By: Kristine Garbe M.D.   On: 01/03/2019 00:59      Assessment & Plan: Pt is a 38 y.o. Caucasian  female with medical problems of diabetes, GERD, sleep apnea, Roux-en-Y gastric bypass, chronically loose stools, perirectal abscess 2015, chronic intermittent painless rectal bleeding status post hemorrhoid band ligation colonoscopywho was [facility] for evaluation of acute onset of generalized abdominal pain.    Acute kidney injury Hematuria Proteinuria Abnormal appearance of kidney on CT scan with IV contrast Hypokalemia  I discussed case with radiology.  No renal vein thrombosis on CT scan. Urinalysis not entirely consistent with pyelonephritis.  Urine culture is pending. Currently getting empiric antibiotics Hypokalemia possibly secondary to lisinopril/HCTZ. Will order work-up for glomerulonephritis although low suspicion.     LOS: 0 Mervil Wacker 1/31/20209:33 AM  [place], [place]  Note: This note was prepared with Dragon dictation. Any transcription errors are unintentional

## 2019-01-03 NOTE — Consult Note (Signed)
Pharmacy Electrolyte Monitoring Consult:  Pharmacy consulted to assist in monitoring and replacing electrolytes in this      Labs:   Potassium (mmol/L)  Date Value  01/03/2019 3.0 (L)   Magnesium (mg/dL)  Date Value  01/03/2019 2.1   Calcium (mg/dL)  Date Value  01/03/2019 8.1 (L)   Albumin (g/dL)  Date Value  01/02/2019 4.2   Assessment/Plan: 38 y.o. female who was admitted 01/02/18 with a GI bleed. Labs reveal hypokalemia. Ann add on magnesium level shows a therapeutic level. Will replace potassium conservatively at this point with oral KCl 40 mEq BID x2. Pharmacy will follow electrolytes and replace as needed.  Vallery Sa, PharmD Clinical Pharmacist

## 2019-01-03 NOTE — ED Notes (Signed)
Patient transferred to CT

## 2019-01-04 LAB — BASIC METABOLIC PANEL WITH GFR
Anion gap: 3 — ABNORMAL LOW (ref 5–15)
BUN: 16 mg/dL (ref 6–20)
CO2: 29 mmol/L (ref 22–32)
Calcium: 8.3 mg/dL — ABNORMAL LOW (ref 8.9–10.3)
Chloride: 109 mmol/L (ref 98–111)
Creatinine, Ser: 1.01 mg/dL — ABNORMAL HIGH (ref 0.44–1.00)
GFR calc Af Amer: >60 mL/min
GFR calc non Af Amer: >60 mL/min
Glucose, Bld: 90 mg/dL (ref 70–99)
Potassium: 3.6 mmol/L (ref 3.5–5.1)
Sodium: 141 mmol/L (ref 135–145)

## 2019-01-04 LAB — URINE CULTURE
Culture: NO GROWTH
Special Requests: NORMAL

## 2019-01-04 LAB — HCV COMMENT:

## 2019-01-04 LAB — C4 COMPLEMENT: COMPLEMENT C4, BODY FLUID: 16 mg/dL (ref 14–44)

## 2019-01-04 LAB — POTASSIUM: Potassium: 3.5 mmol/L (ref 3.5–5.1)

## 2019-01-04 LAB — ANA W/REFLEX IF POSITIVE: Anti Nuclear Antibody(ANA): NEGATIVE

## 2019-01-04 LAB — HEPATITIS B SURFACE ANTIBODY,QUALITATIVE: Hep B S Ab: NONREACTIVE

## 2019-01-04 LAB — HEPATITIS B SURFACE ANTIGEN: Hepatitis B Surface Ag: NEGATIVE

## 2019-01-04 LAB — HEPATITIS B CORE ANTIBODY, IGM: HEP B C IGM: NEGATIVE

## 2019-01-04 LAB — HIV ANTIBODY (ROUTINE TESTING W REFLEX): HIV Screen 4th Generation wRfx: NONREACTIVE

## 2019-01-04 LAB — C3 COMPLEMENT: C3 Complement: 112 mg/dL (ref 82–167)

## 2019-01-04 LAB — GLUCOSE, CAPILLARY: Glucose-Capillary: 91 mg/dL (ref 70–99)

## 2019-01-04 LAB — HEPATITIS C ANTIBODY (REFLEX): HCV Ab: <0.1 s/co ratio (ref 0.0–0.9)

## 2019-01-04 MED ORDER — ALUM & MAG HYDROXIDE-SIMETH 200-200-20 MG/5ML PO SUSP
30.0000 mL | ORAL | Status: DC | PRN
  Administered 2019-01-04: 30 mL via ORAL
  Filled 2019-01-04: qty 30

## 2019-01-04 MED ORDER — FLUCONAZOLE 100 MG PO TABS: 100.0000 mg | ORAL_TABLET | Freq: Every day | ORAL | 0 refills | Status: DC

## 2019-01-04 MED ORDER — CEPHALEXIN 500 MG PO CAPS: 500.0000 mg | ORAL_CAPSULE | Freq: Two times a day (BID) | ORAL | 0 refills | Status: AC

## 2019-01-04 NOTE — Progress Notes (Signed)
     Patricia Gonzalez was admitted to the Hospital on 01/02/2019 and Discharged  01/04/2019 and should be excused from work/school   for 4 days starting 01/02/2019 , may return to work/school without any restrictions.    Epifanio Lesches M.D on 01/04/2019,at 9:49 AM

## 2019-01-04 NOTE — Consult Note (Signed)
Pharmacy Electrolyte Monitoring Consult:  Pharmacy consulted to assist in monitoring and replacing electrolytes in this      Labs:   Potassium (mmol/L)  Date Value  01/04/2019 3.5  01/04/2019 3.6   Magnesium (mg/dL)  Date Value  01/03/2019 2.1   Calcium (mg/dL)  Date Value  01/04/2019 8.3 (L)   Albumin (g/dL)  Date Value  01/02/2019 4.2   Assessment/Plan: 38 y.o. female who was admitted 01/02/18 with a GI bleed. Labs reveal hypokalemia. An add on magnesium level shows a therapeutic level. Will replace potassium conservatively at this point with oral KCl 40 mEq BID x2. Pharmacy will follow electrolytes and replace as needed.  2/1: K 3.5, 3.6, no supplementation needed at this time. Recheck BMET in AM.   Rayna Sexton, PharmD, BCPS Clinical Pharmacist 01/04/2019 9:02 AM

## 2019-01-04 NOTE — Progress Notes (Signed)
[facility], [place] 01/04/19  Subjective:   Abdominal pain is significantly better.  Patient reports some discomfort because of acid reflux.  Serum creatinine has improved to 1.01  Objective:  Vital signs in last 24 hours:  Temp:  [97.3 F (36.3 C)-98 F (36.7 C)] 97.4 F (36.3 C) (02/01 0534) Pulse Rate:  [48-66] 66 (02/01 0534) Resp:  [18-20] 18 (02/01 0534) BP: (137-159)/(73-96) 137/81 (02/01 0534) SpO2:  [97 %-100 %] 97 % (02/01 0534)  Weight change:  Filed Weights   01/02/19 2248  Weight: 76.2 kg    Intake/Output:    Intake/Output Summary (Last 24 hours) at 01/04/2019 1143 Last data filed at 01/04/2019 0600 Gross per 24 hour  Intake 880.11 ml  Output 2200 ml  Net -1319.89 ml   Physical Exam: General:  Well-appearing, laying in the bed  HEENT  anicteric, moist oral mucous membranes  Neck:  Supple, no masses, no bruits  Lungs:  Normal breathing effort on room air, clear to auscultation  Heart::  Regular rhythm, no rub  Abdomen:  Soft,  Extremities:  No peripheral edema  Neurologic:  Alert, oriented  Skin:  No acute rashes, multiple tattoos     Basic Metabolic Panel:  Recent Labs  Lab 01/02/19 2250 01/03/19 0525 01/04/19 0351  NA 138 140 141  K 3.0* 3.0* 3.6  3.5  CL 103 106 109  CO2 27 27 29   GLUCOSE 124* 148* 90  BUN 22* 22* 16  CREATININE 1.39* 1.59* 1.01*  CALCIUM 8.6* 8.1* 8.3*  MG  --  2.1  --      CBC: Recent Labs  Lab 01/02/19 2250 01/03/19 0525  WBC 11.6* 7.6  HGB 14.1 12.9  HCT 40.2 36.9  MCV 94.4 95.1  PLT 210 187      Lab Results  Component Value Date   HEPBSAG Negative 01/03/2019   HEPBSAB Non Reactive 01/03/2019   HEPBIGM Negative 01/03/2019      Microbiology:  Recent Results (from the past 240 hour(s))  Urine Culture     Status: None   Collection Time: 01/02/19 10:50 PM  Result Value Ref Range Status   Specimen Description   Final    URINE, CLEAN CATCH Performed at [facility], [address] [place], [place] [address]    Special Requests   Final    Normal Performed at [facility], [address], [place], [place] [address]    Culture   Final    NO GROWTH Performed at [facility], [place] [address] [place], [place] [address]    Report Status 01/04/2019 FINAL  Final    Coagulation Studies: No results for input(s): LABPROT, INR in the last 72 hours.  Urinalysis: Recent Labs    01/02/19 2250  COLORURINE STRAW*  LABSPEC 1.006  PHURINE 6.0  GLUCOSEU NEGATIVE  HGBUR SMALL*  BILIRUBINUR NEGATIVE  KETONESUR NEGATIVE  PROTEINUR 30*  NITRITE NEGATIVE  LEUKOCYTESUR NEGATIVE      Imaging: Ct Abdomen Pelvis W Contrast  Result Date: 01/03/2019 CLINICAL DATA:  38 y/o F; generalized abdominal pain and flank pain. EXAM: CT ABDOMEN AND PELVIS WITH CONTRAST TECHNIQUE: Multidetector CT imaging of the abdomen and pelvis was performed using the standard protocol following bolus administration of intravenous contrast. CONTRAST:  136mL ISOVUE-300 IOPAMIDOL (ISOVUE-300) INJECTION 61% COMPARISON:  None. FINDINGS: Lower chest: No acute abnormality. Hepatobiliary: No focal liver abnormality is seen. Status post cholecystectomy. No biliary dilatation. Pancreas: Unremarkable. No pancreatic ductal dilatation or  surrounding inflammatory changes. Spleen: Normal in size without focal abnormality. Adrenals/Urinary Tract: Adrenal glands are unremarkable. Diffuse kidney edema and perinephric stranding. No urinary stone disease or hydronephrosis. Normal bladder. Stomach/Bowel: Stomach is within normal limits. Appendix appears normal. No evidence of bowel wall thickening, distention, or inflammatory changes. Vascular/Lymphatic: Aortic atherosclerosis. No enlarged abdominal or pelvic lymph nodes. Reproductive: Left ovarian 23 mm nodule measuring -64 HU compatible with dermoid (series 5, image 53. Normal appearance of ureter and right adnexa. Other: No abdominal  wall hernia or abnormality. No abdominopelvic ascites. Musculoskeletal: No acute or significant osseous findings. IMPRESSION: 1. Diffuse kidney edema and perinephric stranding, possibly pyelonephritis or acute kidney injury. No obstructive uropathy. 2. 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 Electronically Signed   By: Kristine Garbe M.D.   On: 01/03/2019 00:59     Medications:   . sodium chloride 75 mL/hr at 01/04/19 0909  . cefTRIAXone (ROCEPHIN)  IV 1 g (01/03/19 2248)   . fluconazole  100 mg Oral Daily  . heparin  5,000 Units Subcutaneous Q8H  . insulin aspart  0-9 Units Subcutaneous TID WC  . multivitamin with minerals  1 tablet Oral Daily  . oxyCODONE-acetaminophen  1 tablet Oral Once  . pantoprazole  40 mg Oral Daily  . propranolol ER  60 mg Oral Daily  . topiramate  25 mg Oral BID   alum & mag hydroxide-simeth, butalbital-acetaminophen-caffeine, docusate sodium, ondansetron (ZOFRAN) IV, oxyCODONE-acetaminophen, SUMAtriptan  Assessment/ Plan:  38 y.o. female with medical problems of diabetes, GERD, sleep apnea, Roux-en-Y gastric bypass, chronically loose stools, perirectal abscess 2015, chronic intermittent painless rectal bleeding status post hemorrhoid band ligation colonoscopywho was admitted to [facility] on 01/02/2019 for evaluation of acute onset of generalized abdominal pain.    Acute kidney injury Hematuria Proteinuria Abnormal appearance of kidney on CT scan with IV contrast Hypokalemia  Serum creatinine has improved significantly although not quite back to baseline.  Hepatitis B, hepatitis C and HIV results are negative.  Urine protein to creatinine ratio 1.2 g.  Urine culture is negative. We will follow-up in office next week to go over pending serologies and to ensure that serum creatinine is back to baseline.  We will reevaluate the use of HCTZ/lisinopril as outpatient.   LOS: [place] 2/1/202011:43 AM  [place],  [place]  Note: This note was prepared with Dragon dictation. Any transcription errors are unintentional

## 2019-01-04 NOTE — Discharge Summary (Addendum)
Patricia Gonzalez, is a 38 y.o. female  DOB [birth year]  MRN [id number].  Admission date:  01/02/2019  Admitting Physician  Vaughan Basta, MD  Discharge Date:  01/04/2019   Primary MD  Donnie Coffin, MD  Recommendations for primary care physician for things to follow:   Follow-up with PCP in 1 week Follow-up with nephrology Dr.Harmeet Candiss [place] Friday next week. Admission Diagnosis  Acute kidney injury ([place]) [N17.9] Intractable pain [R52] Dermoid cyst of left ovary [D27.1] Urinary tract infection without hematuria, site unspecified [N39.0] Proteinuria, unspecified type [R80.9]   Discharge Diagnosis  Acute kidney injury ([place]) [N17.9] Intractable pain [R52] Dermoid cyst of left ovary [D27.1] Urinary tract infection without hematuria, site unspecified [N39.0] Proteinuria, unspecified type [R80.9]    Principal Problem:   Abdominal pain Active Problems:   Acute pyelonephritis   Acute renal failure (ARF) (HCC)      Past Medical History:  Diagnosis Date  . GERD (gastroesophageal reflux disease)   . Headache   . Hypertension     Past Surgical History:  Procedure Laterality Date  . CHOLECYSTECTOMY    . COLONOSCOPY    . COLONOSCOPY WITH PROPOFOL N/A 12/09/2018   Procedure: COLONOSCOPY WITH PROPOFOL;  Surgeon: Lin Landsman, MD;  Location: [facility] ENDOSCOPY;  Service: Gastroenterology;  Laterality: N/A;  . CYST EXCISION    . LAPAROSCOPIC GASTRIC SLEEVE RESECTION WITH HIATAL HERNIA REPAIR  01/02/2017   Procedure: LAPAROSCOPIC GASTRIC SLEEVE RESECTION WITH HIATAL HERNIA REPAIR;  Surgeon: Bonner Puna, MD;  Location: [facility] ORS;  Service: General;;       History of present illness and  Hospital Course:     Kindly see H&P for history of present illness and admission details, please review complete Labs, Consult  reports and Test reports for all details in brief  HPI  from the history and physical done on the day of admission 38 year old female with multiple medical problems of diabetes mellitus type 2, GERD, sleep apnea, history of gastric bypass, migraine headaches comes in because of severe flank pain bilaterally and found to have acute renal failure, pyelonephritis and admitted for the same.    Hospital Course  : Acute severe abdominal pain and flank pain secondary to acute pyelonephritis.  Patient did not have UTI started on antibiotics because of pyelonephritis, acute kidney injury improved with IV hydration, patient CT of abdomen on admission did not show any renal vein thrombosis, seen by nephrology, nephrology felt that she had abnormal appearance on the kidney on the CAT scan with contrast, he recommended work-up for glomerulonephritis.  Patient had blood work done for glomerulonephritis, she can see nephrology in the office next Friday that is below the seventh. 2.  Hypokalemia secondary to lisinopril/HCTZ: Improved. 3.  History of obesity, patient had gastric bypass, said she is not taking metformin anymore.. 4.  Acute kidney injury secondary to ATN with poor p.o. intake, patient kidney function improved, creatinine 1.59 admission done now it is 1.01. 5.  History of migraine headaches, patient takes Fioricet, sumatriptan at home he she can continue with that she is on multiple narcotics are discontinued some of them.  Patient also on atenolol, Topamax. 6.  Acute kidney injury recovered, advised to stay away from ibuprofen 7.  GERD: Patient can continue her Dexilant.. 8.  Patient received Diflucan for 2 days, she wanted another dose of Diflucan prescription just in case if she needs it.  Also given Keflex for 2 days.   Discharge Condition: stable   Follow  UP  Follow-up Information    Aycock, Ngwe A, MD. Schedule an appointment as soon as possible for a visit in 1 week(s).   Specialty:   Family Medicine Contact information: [place] [address] [phone]             Discharge Instructions  and  Discharge Medications         Allergies as of 01/04/2019      Reactions   Ketorolac Tromethamine Anaphylaxis   Shots   Morphine Other (See Comments)   Caused stomach pain      Medication List    STOP taking these medications   HYCET 7.5-325 mg/15 ml solution Generic drug:  HYDROcodone-acetaminophen   HYDROcodone-acetaminophen 5-325 MG tablet Commonly known as:  NORCO/VICODIN   ibuprofen 600 MG tablet Commonly known as:  ADVIL,MOTRIN     TAKE these medications   acyclovir 400 MG tablet Commonly known as:  ZOVIRAX acyclovir 400 mg tablet  take 1 tablet by mouth three times a day for 5 days if needed FLARE   atenolol 100 MG tablet Commonly known as:  TENORMIN atenolol 100 mg tablet   butalbital-acetaminophen-caffeine 50-325-40 MG tablet Commonly known as:  FIORICET, ESGIC Take 1-2 tablets by mouth every 4 (four) hours as needed for headache.   butalbital-acetaminophen-caffeine 50-325-40-30 MG capsule Commonly known as:  FIORICET WITH CODEINE Take 1-2 capsules by mouth every 6 (six) hours as needed. Max 6 capsule per day   cephALEXin 500 MG capsule Commonly known as:  KEFLEX Take 1 capsule (500 mg total) by mouth 2 (two) times daily for 3 days.   cetirizine 10 MG tablet Commonly known as:  ZYRTEC Take by mouth.   chlorpheniramine-HYDROcodone 10-8 MG/5ML Suer Commonly known as:  TUSSIONEX hydrocodone 10 mg-chlorpheniramine 8 mg/5 mL oral susp extend.rel 12hr  take 5 milliliters by mouth every 12 hours if needed for cough   cyclobenzaprine 10 MG tablet Commonly known as:  FLEXERIL Take 10 mg by mouth every 8 (eight) hours as needed for muscle spasms.   desogestrel-ethinyl estradiol 0.15-30 MG-MCG tablet Commonly known as:  APRI,EMOQUETTE,SOLIA Take 1 tablet by mouth daily.   RECLIPSEN 0.15-30 MG-MCG  tablet Generic drug:  desogestrel-ethinyl estradiol Take 1 tablet by mouth daily.   DEXILANT 60 MG capsule Generic drug:  dexlansoprazole Take 60 mg by mouth daily.   enoxaparin 40 MG/0.4ML injection Commonly known as:  LOVENOX Inject 0.4 mLs (40 mg total) into the skin daily.   fluconazole 100 MG tablet Commonly known as:  DIFLUCAN Take 1 tablet (100 mg total) by mouth daily. Start taking on:  January 05, 2019   Influenza vac split quadrivalent PF 0.5 ML injection Commonly known as:  FLUARIX Fluarix Quad 2017-2018 (PF) 60 mcg (15 mcg x 4)/0.5 mL IM syringe  inject 0.5 milliliter intramuscularly   levocetirizine 5 MG tablet Commonly known as:  XYZAL Take 5 mg by mouth every morning.   lisinopril-hydrochlorothiazide 20-25 MG tablet Commonly known as:  PRINZIDE,ZESTORETIC Take 1 tablet by mouth daily.   metFORMIN 500 MG 24 hr tablet Commonly known as:  GLUCOPHAGE-XR metformin ER 500 mg tablet,extended release 24 hr   mupirocin ointment 2 % Commonly known as:  BACTROBAN Apply 1 application topically 3 (three) times daily.   omeprazole 40 MG capsule Commonly known as:  PRILOSEC omeprazole 40 mg capsule,delayed release   ondansetron 4 MG disintegrating tablet Commonly known as:  ZOFRAN ODT Take 1 tablet (4 mg total) by mouth every 4 (four) hours  as needed for nausea or vomiting.   Oxycodone HCl 10 MG Tabs Take by mouth.   PEG 3350 Powd Take by mouth.   predniSONE 10 MG (21) Tbpk tablet Commonly known as:  STERAPRED UNI-PAK 21 TAB Dispense one 6 day pack. Take as directed with food.   PREVIDENT 5000 SENSITIVE 1.1-5 % Pste Generic drug:  Sod Fluoride-Potassium Nitrate BRUSH WITH PREVIDENT 2 TIMES A DAY   propranolol 60 MG tablet Commonly known as:  INDERAL Take 60 mg by mouth every morning.   propranolol 40 MG tablet Commonly known as:  INDERAL Take by mouth.   rizatriptan 10 MG disintegrating tablet Commonly known as:  MAXALT-MLT rizatriptan 10 mg  disintegrating tablet  take 1 tablet by mouth ONCE AS NEEDED FOR MIGRAINE FOR UP TO 1 DO...  (REFER TO PRESCRIPTION NOTES).   silver sulfADIAZINE 1 % cream Commonly known as:  SILVADENE Apply 1 application topically daily.   SUMAtriptan 100 MG tablet Commonly known as:  IMITREX Take 100 mg by mouth every 2 (two) hours as needed for migraine. May repeat in 2 hours if headache persists or recurs.   topiramate 25 MG tablet Commonly known as:  TOPAMAX Take 25 mg by mouth 2 (two) times daily.   tretinoin 0.025 % cream Commonly known as:  RETIN-A tretinoin 0.025 % topical cream   valACYclovir 500 MG tablet Commonly known as:  VALTREX Take 2,000 mg by mouth 2 (two) times daily as needed.   verapamil 180 MG CR tablet Commonly known as:  CALAN-SR verapamil ER (SR) 180 mg tablet,extended release   zolpidem 10 MG tablet Commonly known as:  AMBIEN TK 1 T PO IMMEDIATELY BEFORE BEDTIME FOR SLEEP         Diet and Activity recommendation: See Discharge Instructions above   Consults obtained -nephrology  major procedure: Diagnostic:-Neurology e and Radiology Reports - PLEASE review detailed and final reports for all details, in brief -      Ct Abdomen Pelvis W Contrast  Result Date: 01/03/2019 CLINICAL DATA:  38 y/o F; generalized abdominal pain and flank pain. EXAM: CT ABDOMEN AND PELVIS WITH CONTRAST TECHNIQUE: Multidetector CT imaging of the abdomen and pelvis was performed using the standard protocol following bolus administration of intravenous contrast. CONTRAST:  166mL ISOVUE-300 IOPAMIDOL (ISOVUE-300) INJECTION 61% COMPARISON:  None. FINDINGS: Lower chest: No acute abnormality. Hepatobiliary: No focal liver abnormality is seen. Status post cholecystectomy. No biliary dilatation. Pancreas: Unremarkable. No pancreatic ductal dilatation or surrounding inflammatory changes. Spleen: Normal in size without focal abnormality. Adrenals/Urinary Tract: Adrenal glands are unremarkable.  Diffuse kidney edema and perinephric stranding. No urinary stone disease or hydronephrosis. Normal bladder. Stomach/Bowel: Stomach is within normal limits. Appendix appears normal. No evidence of bowel wall thickening, distention, or inflammatory changes. Vascular/Lymphatic: Aortic atherosclerosis. No enlarged abdominal or pelvic lymph nodes. Reproductive: Left ovarian 23 mm nodule measuring -64 HU compatible with dermoid (series 5, image 53. Normal appearance of ureter and right adnexa. Other: No abdominal wall hernia or abnormality. No abdominopelvic ascites. Musculoskeletal: No acute or significant osseous findings. IMPRESSION: 1. Diffuse kidney edema and perinephric stranding, possibly pyelonephritis or acute kidney injury. No obstructive uropathy. 2. 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 Electronically Signed   By: Kristine Garbe M.D.   On: 01/03/2019 00:59    Micro Results     No results found for this or any previous visit (from the past 240 hour(s)).     Today   Subjective:   Patricia Gonzalez today has no headache,no chest  abdominal pain,no new weakness tingling or numbness, feels much better wants to go home today.  Objective:   Blood pressure 137/81, pulse 66, temperature (!) 97.4 F (36.3 C), temperature source Oral, resp. rate 18, height 5\' 4"  (1.626 m), weight 76.2 kg, SpO2 97 %.   Intake/Output Summary (Last 24 hours) at 01/04/2019 0950 Last data filed at 01/04/2019 0600 Gross per 24 hour  Intake 1389.03 ml  Output 2700 ml  Net -1310.97 ml    Exam Awake Alert, Oriented x 3, No new F.N deficits, Normal affect [place].AT,PERRAL Supple Neck,No JVD, No cervical lymphadenopathy appriciated.  Symmetrical Chest wall movement, Good air movement bilaterally, CTAB RRR,No Gallops,Rubs or new Murmurs, No Parasternal Heave +ve B.Sounds, Abd Soft, Non tender, No organomegaly appriciated, No rebound -guarding or rigidity. No Cyanosis, Clubbing or edema, No new Rash or bruise  Data  Review   CBC w Diff:  Lab Results  Component Value Date   WBC 7.6 01/03/2019   HGB 12.9 01/03/2019   HCT 36.9 01/03/2019   PLT 187 01/03/2019   LYMPHOPCT 24 01/03/2017   MONOPCT 8 01/03/2017   EOSPCT 0 01/03/2017   BASOPCT 0 01/03/2017    CMP:  Lab Results  Component Value Date   NA 141 01/04/2019   K 3.5 01/04/2019   K 3.6 01/04/2019   CL 109 01/04/2019   CO2 29 01/04/2019   BUN 16 01/04/2019   CREATININE 1.01 (H) 01/04/2019   PROT 6.9 01/02/2019   ALBUMIN 4.2 01/02/2019   BILITOT 0.5 01/02/2019   ALKPHOS 57 01/02/2019   AST 26 01/02/2019   ALT 25 01/02/2019  .   Total Time in preparing paper work, data evaluation and todays exam - 75 minutes  Epifanio Lesches M.D on 01/04/2019 at 9:50 AM    Note: This dictation was prepared with Dragon dictation along with smaller phrase technology. Any transcriptional errors that result from this process are unintentional.

## 2019-01-06 LAB — PROTEIN ELECTROPHORESIS, SERUM
A/G RATIO SPE: 1.3 (ref 0.7–1.7)
ALPHA-1-GLOBULIN: 0.3 g/dL (ref 0.0–0.4)
Albumin ELP: 3.1 g/dL (ref 2.9–4.4)
Alpha-2-Globulin: 0.7 g/dL (ref 0.4–1.0)
BETA GLOBULIN: 0.7 g/dL (ref 0.7–1.3)
Gamma Globulin: 0.6 g/dL (ref 0.4–1.8)
Globulin, Total: 2.3 g/dL (ref 2.2–3.9)
Total Protein ELP: 5.4 g/dL — ABNORMAL LOW (ref 6.0–8.5)

## 2019-01-06 LAB — ANCA TITERS
Atypical P-ANCA titer: <1:20 {titer}
C-ANCA: <1:20 {titer}
P-ANCA: <1:20 {titer}

## 2019-01-06 LAB — KAPPA/LAMBDA LIGHT CHAINS
Kappa free light chain: 19.7 mg/L — ABNORMAL HIGH (ref 3.3–19.4)
Kappa, lambda light chain ratio: 1.28 (ref 0.26–1.65)
Lambda free light chains: 15.4 mg/L (ref 5.7–26.3)

## 2019-07-12 ENCOUNTER — Ambulatory Visit
Admission: EM | Admit: 2019-07-12 | Discharge: 2019-07-12 | Disposition: A | Discharge status: Home / Self Care | Payer: BLUE CROSS/BLUE SHIELD | Attending: Urgent Care | Admitting: Urgent Care

## 2019-07-12 ENCOUNTER — Other Ambulatory Visit: Payer: Self-pay

## 2019-07-12 ENCOUNTER — Ambulatory Visit (INDEPENDENT_AMBULATORY_CARE_PROVIDER_SITE_OTHER): Payer: BLUE CROSS/BLUE SHIELD

## 2019-07-12 ENCOUNTER — Encounter: Payer: Self-pay | Admitting: Emergency Medicine

## 2019-07-12 DIAGNOSIS — M79644 Pain in right finger(s): Secondary | ICD-10-CM | POA: Diagnosis not present

## 2019-07-12 DIAGNOSIS — L03011 Cellulitis of right finger: Secondary | ICD-10-CM | POA: Diagnosis not present

## 2019-07-12 MED ORDER — CEPHALEXIN 500 MG PO CAPS: 500.0000 mg | ORAL_CAPSULE | Freq: Four times a day (QID) | ORAL | 0 refills | Status: DC

## 2019-07-12 MED ORDER — FLUCONAZOLE 150 MG PO TABS: ORAL_TABLET | ORAL | 0 refills | Status: DC

## 2019-07-12 NOTE — ED Provider Notes (Signed)
[place], [place]   Name: Patricia Gonzalez DOB: 1981/05/10 MRN: [id number] CSN: [id number] PCP: Donnie Coffin, MD  Arrival date and time:  07/12/19 1146  Chief Complaint:  Swollen Thumb   NOTE: Prior to seeing the patient today, I have reviewed the triage nursing documentation and vital signs. Clinical staff has updated patient's PMH/PSHx, current medication list, and drug allergies/intolerances to ensure comprehensive history available to assist in medical decision making.   History:   HPI: Patricia Gonzalez is a 38 y.o. female who presents today with complaints of pain and swelling to her RIGHT thumb for the last 1.5 - 2 weeks. Patient describes that her pain is secondary to a penetrating injury caused by the thorn form either a "rose or [place] briar bush". Patient notes that the thorn was fairly large in size. Thorn was felt to be removed fully intact. Thumb was cleansed with soap and water and peroxide before she applied TAO. Patient has continued daily wound care. Over the course of the last few days, swelling has worsened and thumb is draining "yellow pus". She denies any fevers.   Past Medical History:  Diagnosis Date  . GERD (gastroesophageal reflux disease)   . Headache   . Hypertension     Past Surgical History:  Procedure Laterality Date  . CHOLECYSTECTOMY    . COLONOSCOPY    . COLONOSCOPY WITH PROPOFOL N/A 12/09/2018   Procedure: COLONOSCOPY WITH PROPOFOL;  Surgeon: Lin Landsman, MD;  Location: [facility] ENDOSCOPY;  Service: Gastroenterology;  Laterality: N/A;  . CYST EXCISION    . LAPAROSCOPIC GASTRIC SLEEVE RESECTION WITH HIATAL HERNIA REPAIR  01/02/2017   Procedure: LAPAROSCOPIC GASTRIC SLEEVE RESECTION WITH HIATAL HERNIA REPAIR;  Surgeon: Bonner Puna, MD;  Location: [facility] ORS;  Service: General;;    Family History  Problem Relation Age of Onset  . CAD Father   . Lung cancer Father     Social History   Tobacco Use  . Smoking status: Former Smoker    Packs/day:  0.50    Years: 15.00    Pack years: 7.50    Types: Cigarettes    Quit date: 12/27/2015    Years since quitting: 3.5  . Smokeless tobacco: Never Used  . Tobacco comment: CURRENTLY VAPES  Substance Use Topics  . Alcohol use: No  . Drug use: No    Patient Active Problem List   Diagnosis Date Noted  . Abdominal pain 01/03/2019  . Acute pyelonephritis 01/03/2019  . Acute renal failure (ARF) ([place]) 01/03/2019  . Rectal bleeding   . Intractable chronic migraine without aura and without status migrainosus 01/18/2017  . Morbid obesity ([place]) 01/02/2017  . Obstructive sleep apnea syndrome 07/11/2016  . Diabetes mellitus ([place]) 05/09/2016  . Hypertensive disorder 05/09/2016  . 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 butalbital-acetaminophen-caffeine (FIORICET, ESGIC) 50-325-40 MG tablet Take 1-2 tablets by mouth every 4 (four) hours as needed for headache.  . cyclobenzaprine (FLEXERIL) 10 MG tablet Take 10 mg by mouth every 8 (eight) hours as needed for muscle spasms.  Marland Kitchen desogestrel-ethinyl estradiol (RECLIPSEN) 0.15-30 MG-MCG tablet Take 1 tablet by mouth daily.  Marland Kitchen dexlansoprazole (DEXILANT) 60 MG capsule Take 60 mg by mouth daily.  Marland Kitchen levocetirizine (XYZAL) 5 MG tablet Take 5 mg by mouth every morning.  Marland Kitchen lisinopril-hydrochlorothiazide (PRINZIDE,ZESTORETIC) 20-25 MG tablet Take 1 tablet by mouth daily.  . propranolol (INDERAL) 60 MG tablet Take  60 mg by mouth every morning.  . rizatriptan (MAXALT-MLT) 10 MG disintegrating tablet rizatriptan 10 mg disintegrating tablet  take 1 tablet by mouth ONCE AS NEEDED FOR MIGRAINE FOR UP TO 1 DO...  (REFER TO PRESCRIPTION NOTES).  . SUMAtriptan (IMITREX) 100 MG tablet Take 100 mg by mouth every 2 (two) hours as needed for migraine. May repeat in 2 hours if headache persists or recurs.  . topiramate (TOPAMAX) 25 MG tablet Take 25 mg by mouth 2 (two) times daily.  Marland Kitchen zolpidem  (AMBIEN) 10 MG tablet TK 1 T PO IMMEDIATELY BEFORE BEDTIME FOR SLEEP    Allergies:   Ketorolac tromethamine and Morphine  Review of Systems (ROS): Review of Systems  Constitutional: Negative for chills and fever.  Respiratory: Negative for cough and shortness of breath.   Cardiovascular: Negative for chest pain and palpitations.  Gastrointestinal: Negative for diarrhea, nausea and vomiting.  Musculoskeletal:       Pain and swelling to RIGHT thumb s/p penetrating injury  Skin: Positive for color change and wound.  Neurological: Negative for dizziness, syncope, weakness and headaches.  Hematological: Negative for adenopathy.  All other systems reviewed and are negative.    Vital Signs: Today's Vitals   07/12/19 1201 07/12/19 1205 07/12/19 1238  BP:  120/77   Pulse:  72   Resp:  16   Temp:  98.4 F (36.9 C)   TempSrc:  Oral   SpO2:  99%   Weight: 170 lb (77.1 kg)    Height: 5\' 5"  (1.651 m)    PainSc: 10-Worst pain ever  10-Worst pain ever    Physical Exam: Physical Exam  Constitutional: She is oriented to person, place, and time and well-developed, well-nourished, and in no distress.  HENT:  Head: Normocephalic and atraumatic.  Mouth/Throat: Mucous membranes are normal.  Eyes: Pupils are equal, round, and reactive to light. EOM are normal.  Neck: Normal range of motion. Neck supple. No tracheal deviation present.  Cardiovascular: Normal rate and intact distal pulses.  Pulmonary/Chest: Effort normal. No respiratory distress.  Musculoskeletal:     Right hand: She exhibits tenderness and swelling. She exhibits normal range of motion, no bony tenderness, normal two-point discrimination, normal capillary refill, no deformity and no laceration. Normal sensation noted. Normal strength noted.       Hands:  Lymphadenopathy:    She has no cervical adenopathy.  Neurological: She is alert and oriented to person, place, and time. Gait normal. GCS score is 15.  Skin: Skin is warm  and dry. No rash noted.  Psychiatric: Mood, memory, affect and judgment normal.  Nursing note and vitals reviewed.   Urgent Care Treatments / Results:   LABS: PLEASE NOTE: all labs that were ordered this encounter are listed, however only abnormal results are displayed. Labs Reviewed - No data to display  EKG: -None  RADIOLOGY: Dg Finger Thumb Right  Result Date: 07/12/2019 CLINICAL DATA:  Infection right first finger with possible retained foreign body. EXAM: RIGHT THUMB 2+V COMPARISON:  None. FINDINGS: No fracture or radiopaque foreign body identified. There is soft tissue swelling along the volar aspect of the distal right first finger. IMPRESSION: No fracture or soft tissue foreign body identified. Electronically Signed   By: Aletta Edouard M.D.   On: 07/12/2019 12:30    PROCEDURES: Procedures  MEDICATIONS RECEIVED THIS VISIT: Medications - No data to display  PERTINENT CLINICAL COURSE NOTES/UPDATES:   Initial Impression / Assessment and Plan / Urgent Care Course:  Pertinent labs & imaging  results that were available during my care of the patient were personally reviewed by me and considered in my medical decision making (see lab/imaging section of note for values and interpretations).  Patricia Gonzalez is a 38 y.o. female who [facility] today with complaints of Swollen Thumb   Patient is well appearing overall in clinic today. She does not appear to be in any acute distress. Presenting symptoms (see HPI) and exam as documented above. Diagnostic radiographs of the RIGHT first digit revealed no evidence of a retained (radiopaque) foreign body. Exam reveals (+) cellulitic changes. Will cover with a 5 day course of cephalexin. Discussed continued topical wound care; clean/dry, TAO, cover while in public or at work. Patient encouraged to soak hand in warm Epson salt water BID for the next few days to help resolve pain/swelling/inflammation. May use Tylenol  and/or Ibuprofen as needed for pain. Patient requesting fluconazole due to prescribed ABX course; prescribed as requested.   Discussed follow up with primary care physician in 1 week for re-evaluation. I have reviewed the follow up and strict return precautions for any new or worsening symptoms. Patient is aware of symptoms that would be deemed urgent/emergent, and would thus require further evaluation either here or in the emergency department. At the time of discharge, she verbalized understanding and consent with the discharge plan as it was reviewed with her. All questions were fielded by provider and/or clinic staff prior to patient discharge.    Final Clinical Impressions / Urgent Care Diagnoses:   Final diagnoses:  Cellulitis of right thumb    New Prescriptions:  [place] Controlled Substance Registry consulted? Not Applicable  Meds ordered this encounter  Medications  . cephALEXin (KEFLEX) 500 MG capsule    Sig: Take 1 capsule (500 mg total) by mouth 4 (four) times daily.    Dispense:  20 capsule    Refill:  0  . fluconazole (DIFLUCAN) 150 MG tablet    Sig: Take 1 tablet (150 mg) x 1 dose while on ABX. May repeat in 3 days for persistent symptoms.    Dispense:  2 tablet    Refill:  0   Recommended Follow up Care:  Patient encouraged to follow up with the following provider within the specified time frame, or sooner as dictated by the severity of her symptoms. As always, she was instructed that for any urgent/emergent care needs, she should seek care either here or in the emergency department for more immediate evaluation.  Follow-up Information    Aycock, Ngwe A, MD In 1 week.   Specialty: Family Medicine Why: General reassessment of symptoms if not improving Contact information: [place] [id number] [phone]         NOTE: This note was prepared using Dragon dictation software along with smaller phrase technology. Despite my best ability to  proofread, there is the potential that transcriptional errors may still occur from this process, and are completely unintentional.    Karen Kitchens, NP 07/12/19 2034

## 2019-07-12 NOTE — ED Triage Notes (Signed)
Patient states that she was stuck by a thorn in her right thumb over a week ago.  Patient c/o swelling, pain, and warmth in her right thumb that started about a week ago.

## 2019-07-12 NOTE — Discharge Instructions (Signed)
It was very nice seeing you today in clinic. Thank you for entrusting me with your care.   Please utilize the medications that we discussed. Your prescriptions have been called in to your pharmacy.   Soak hand in warm Epson salt water daily to help with pain/swelling. May use Tylenol and/or Ibuprofen as needed for pain/fever.   Make arrangements to follow up with your regular doctor in 1 week for re-evaluation if not improving.  If your symptoms/condition worsens, please seek follow up care either here or in the ER. Please remember, our [place] providers are "right here with you" when you need [place].   Again, it was my pleasure to take care of you today. Thank you for choosing our clinic. I hope that you start to feel better quickly.   Honor Loh, MSN, APRN, FNP-C, CEN Advanced Practice Provider [facility]

## 2021-08-05 ENCOUNTER — Ambulatory Visit
Admission: RE | Admit: 2021-08-05 | Discharge: 2021-08-05 | Disposition: A | Discharge status: Home / Self Care | Payer: BLUE CROSS/BLUE SHIELD | Source: Ambulatory Visit | Attending: Emergency Medicine | Admitting: Emergency Medicine

## 2021-08-05 ENCOUNTER — Ambulatory Visit
Admission: EM | Admit: 2021-08-05 | Discharge: 2021-08-05 | Disposition: A | Discharge status: Home / Self Care | Payer: BLUE CROSS/BLUE SHIELD | Attending: Emergency Medicine | Admitting: Emergency Medicine

## 2021-08-05 ENCOUNTER — Encounter: Payer: Self-pay | Admitting: Emergency Medicine

## 2021-08-05 ENCOUNTER — Other Ambulatory Visit: Payer: Self-pay

## 2021-08-05 ENCOUNTER — Ambulatory Visit: Payer: Self-pay

## 2021-08-05 DIAGNOSIS — M47816 Spondylosis without myelopathy or radiculopathy, lumbar region: Secondary | ICD-10-CM | POA: Insufficient documentation

## 2021-08-05 DIAGNOSIS — X58XXXA Exposure to other specified factors, initial encounter: Secondary | ICD-10-CM | POA: Diagnosis not present

## 2021-08-05 DIAGNOSIS — S39012A Strain of muscle, fascia and tendon of lower back, initial encounter: Secondary | ICD-10-CM | POA: Insufficient documentation

## 2021-08-05 DIAGNOSIS — M545 Low back pain, unspecified: Secondary | ICD-10-CM | POA: Diagnosis present

## 2021-08-05 MED ORDER — METHOCARBAMOL 500 MG PO TABS: 500.0000 mg | ORAL_TABLET | Freq: Two times a day (BID) | ORAL | 0 refills | Status: AC

## 2021-08-05 MED ORDER — PREDNISONE 10 MG PO TABS: ORAL_TABLET | ORAL | 0 refills | Status: AC

## 2021-08-05 NOTE — Discharge Instructions (Addendum)
Follow up with PCP to inform of visit and treatment in clinic today as orthopedic or PT referral may be needed if symptoms persist longer than 2-3 weeks.  Increase fluid intake. Apply ice to affected area 3-5 times daily for 15-20 minute intervals.  You may use heat first thing in the morning and last thing at night, otherwise use ice as directed. Take all medications as prescribed (Methocarbamol and Prednisone) DO NOT TAKE FLEXERIL WITH METHOCARBAMOL. May take 1000 mg Tylenol OR 600 mg ibuprofen every 8 hours as needed for pain as long as neither medication is contraindicated to your current health conditions. Pillow underneath knees at night to sleep on your back and/or pillow in between knees to sleep on side. You may use topical analgesics such as BenGay to help with muscle tension to lower back. Return to clinic if symptoms worsen. If you experience any significant worsening of pain, bowel or bladder incontinence, groin paresthesias, fever or chills go to the ER.

## 2021-08-05 NOTE — ED Provider Notes (Signed)
Chief Complaint   Chief Complaint  Patient presents with   Back Pain     Subjective, HPI  Patricia Gonzalez is a 40 y.o. female who presents with low back pain which is midline.  Patient reports that she woke up this morning and was having difficulty sitting or walking.  Patient states that she has had no fall or known injuries.  Patient reports the use of ibuprofen and Flexeril along with BC powder which has not helped. No bowel or bladder incontinence, urinary retention, or groin paresthesias. No fever or chills.  History obtained from patient.   Patient's problem list, past medical and social history, medications, and allergies were reviewed by me and updated in Epic.    ROS  See HPI.  Objective   Vitals:   08/05/21 1459  BP: (!) 145/77  Pulse: 97  Resp: 16  Temp: 98 F (36.7 C)  SpO2: 97%     General: Appears well-developed and well-nourished. No acute distress.  Head: Normocephalic and atraumatic.   Neck: Normal range of motion, neck is supple.  Cardiovascular: Normal rate.  Pulm/Chest: No respiratory distress.  Musculoskeletal: Lumbar Spine: Severe TTP noted to paraspinous musculature of lumbar spine. No midline tenderness or step-off. BLE with 5/5 strength, full sensation. Gait is slow. Neurological: Alert and oriented to person, place, and time.  Skin: Skin is warm and dry.   Psychiatric: Normal mood, affect, behavior, and thought content.   Vital signs and nursing note reviewed.   Data  Imaging None needed. No trauma no falls.  Assessment & Plan  1. Strain of lumbar region, initial encounter - DG Lumbar Spine Complete; Standing - DG Lumbar Spine Complete - methocarbamol (ROBAXIN) 500 MG tablet; Take 1 tablet (500 mg total) by mouth 2 (two) times daily.  Dispense: 20 tablet; Refill: 0 - predniSONE (DELTASONE) 10 MG tablet; Take 6 tablets (60 mg total) by mouth daily for 1 day, THEN 5 tablets (50 mg total) daily for 1 day, THEN 4 tablets (40 mg total)  daily for 1 day, THEN 3 tablets (30 mg total) daily for 1 day, THEN 2 tablets (20 mg total) daily for 1 day, THEN 1 tablet (10 mg total) daily for 1 day.  Dispense: 21 tablet; Refill: 0  40 y.o. female presents with low back pain which is midline.  Patient reports that she woke up this morning and was having difficulty sitting or walking.  Patient states that she has had no fall or known injuries.  Patient reports the use of ibuprofen and Flexeril along with BC powder which has not helped. No bowel or bladder incontinence, urinary retention, or groin paresthesias. No fever or chills.  Chart review completed.  Outpatient lumbar spine imaging shows no acute fracture.  Likely, low back strain.  Rx'd methocarbamol and prednisone to the patient's preferred pharmacy and advised that she does not need to take any Flexeril while she is taking methocarbamol.  Advised of at home treatment and care to include ice, rest, Tylenol versus ibuprofen, topical analgesics.  Return for any significant worsening of pain, bowel or bladder incontinence, groin paresthesias, fever or chills go to the ED.  Patient verbalized understanding and agreed with plan.  Patient stable upon discharge.  Plan:   Discharge Instructions      Follow up with PCP to inform of visit and treatment in clinic today as orthopedic or PT referral may be needed if symptoms persist longer than 2-3 weeks.  Increase fluid intake. Apply ice to  affected area 3-5 times daily for 15-20 minute intervals.  You may use heat first thing in the morning and last thing at night, otherwise use ice as directed. Take all medications as prescribed (Methocarbamol and Prednisone) DO NOT TAKE FLEXERIL WITH METHOCARBAMOL. May take 1000 mg Tylenol OR 600 mg ibuprofen every 8 hours as needed for pain as long as neither medication is contraindicated to your current health conditions. Pillow underneath knees at night to sleep on your back and/or pillow in between knees to  sleep on side. You may use topical analgesics such as BenGay to help with muscle tension to lower back. Return to clinic if symptoms worsen. If you experience any significant worsening of pain, bowel or bladder incontinence, groin paresthesias, fever or chills go to the ER.          Serafina Royals, [place] 08/05/21 [phone]

## 2021-08-05 NOTE — ED Triage Notes (Signed)
Pt said when she woke up this am she could not sit or barely walk. Pt said no fall, no injury. No urination issues.

## 2022-08-23 ENCOUNTER — Ambulatory Visit
Admission: RE | Admit: 2022-08-23 | Discharge: 2022-08-23 | Disposition: A | Discharge status: Home / Self Care | Payer: BLUE CROSS/BLUE SHIELD | Source: Ambulatory Visit

## 2022-08-23 VITALS — BP 130/78 | HR 69 | Temp 97.8°F | Resp 17

## 2022-08-23 DIAGNOSIS — J019 Acute sinusitis, unspecified: Secondary | ICD-10-CM | POA: Diagnosis not present

## 2022-08-23 DIAGNOSIS — B9689 Other specified bacterial agents as the cause of diseases classified elsewhere: Secondary | ICD-10-CM | POA: Diagnosis not present

## 2022-08-23 MED ORDER — AMOXICILLIN-POT CLAVULANATE 875-125 MG PO TABS: 1.0000 | ORAL_TABLET | Freq: Two times a day (BID) | ORAL | 0 refills | Status: DC

## 2022-08-23 MED ORDER — METHYLPREDNISOLONE 4 MG PO TBPK: ORAL_TABLET | ORAL | 0 refills | Status: DC

## 2022-08-23 MED ORDER — FLUCONAZOLE 150 MG PO TABS: 150.0000 mg | ORAL_TABLET | Freq: Once | ORAL | 0 refills | Status: AC

## 2022-08-23 NOTE — Discharge Instructions (Addendum)
Follow-up here or at your primary care provider if your symptoms worsen or do not resolve with treatment.

## 2022-08-23 NOTE — ED Provider Notes (Signed)
Roderic [place]    CSN: [id number] Arrival date & time: 08/23/22  1642      History   Chief Complaint Chief Complaint  Patient presents with   Ear Fullness    Entered by patient   Facial Pain   Otalgia   Headache    HPI Patricia Gonzalez is a 41 y.o. female.   HPI  Presents to UC with symptoms which worsened in the past 4 days.  She reports symptoms of sinus pressure, ear pain, and headache x2 weeks.  Self treated using Z-Pak from previous prescription which relieved her symptoms temporarily but worsened again.    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      Home Medications    Prior to Admission medications   Medication Sig Start Date End Date Taking? Authorizing Provider  butalbital-acetaminophen-caffeine (FIORICET, ESGIC) 50-325-40 MG tablet Take  1-2 tablets by mouth every 4 (four) hours as needed for headache.    [provider]  cephALEXin (KEFLEX) 500 MG capsule Take 1 capsule (500 mg total) by mouth 4 (four) times daily. 07/12/19   Karen Kitchens, NP  cyclobenzaprine (FLEXERIL) 10 MG tablet Take 10 mg by mouth every 8 (eight) hours as needed for muscle spasms.    [provider]  desogestrel-ethinyl estradiol (RECLIPSEN) 0.15-30 MG-MCG tablet Take 1 tablet by mouth daily.    [provider]  dexlansoprazole (DEXILANT) 60 MG capsule Take 60 mg by mouth daily.    [provider]  fluconazole (DIFLUCAN) 150 MG tablet Take 1 tablet (150 mg) x 1 dose while on ABX. May repeat in 3 days for persistent symptoms. 07/12/19   Karen Kitchens, NP  levocetirizine (XYZAL) 5 MG tablet Take 5 mg by mouth every morning.    [provider]  lisinopril-hydrochlorothiazide (PRINZIDE,ZESTORETIC) 20-25 MG tablet Take 1 tablet by mouth daily.    [provider]  methocarbamol (ROBAXIN) 500 MG tablet Take 1 tablet (500 mg total) by mouth 2 (two) times daily. 08/05/21   Boddu, Erasmo Downer, FNP  ondansetron (ZOFRAN ODT) 4 MG disintegrating tablet Take 1 tablet (4 mg total) by mouth every 4 (four) hours as needed for nausea or vomiting. 01/02/17   Mardelle Matte, PA-C  PREVIDENT 5000 SENSITIVE 1.1-5 % PSTE BRUSH WITH PREVIDENT 2 TIMES A DAY 10/11/18   [provider]  propranolol (INDERAL) 60 MG tablet Take 60 mg by mouth every morning.    [provider]  rizatriptan (MAXALT-MLT) 10 MG disintegrating tablet rizatriptan 10 mg disintegrating tablet  take 1 tablet by mouth ONCE AS NEEDED FOR MIGRAINE FOR UP TO 1 DO...  (REFER TO PRESCRIPTION NOTES). 01/08/17   [provider]  SUMAtriptan (IMITREX) 100 MG tablet Take 100 mg by mouth every 2 (two) hours as needed for migraine. May repeat in 2 hours if headache persists or recurs.    [provider]  topiramate (TOPAMAX) 25 MG tablet Take 25 mg by  mouth 2 (two) times daily. 09/24/18   [provider]  tretinoin (RETIN-A) 0.025 % cream tretinoin 0.025 % topical cream    [provider]  valACYclovir (VALTREX) 500 MG tablet Take 2,000 mg by mouth 2 (two) times daily as needed.    [provider]  zolpidem (AMBIEN) 10 MG tablet TK 1 T PO IMMEDIATELY BEFORE BEDTIME FOR SLEEP 09/19/18   [provider]    Family History Family History  Problem Relation Age of Onset   CAD Father    Lung cancer Father     Social History Social History   Tobacco Use   Smoking status: Former    Packs/day: 0.50    Years: 15.00    Total pack years: 7.50    Types: Cigarettes    Quit date: 12/27/2015    Years since quitting: 6.6   Smokeless tobacco: Never   Tobacco comments:    CURRENTLY VAPES  Vaping Use   Vaping Use: Never used  Substance Use Topics   Alcohol use: No   Drug use: No     Allergies   Ketorolac tromethamine and Morphine   Review of Systems Review of Systems   Physical Exam Triage Vital Signs ED Triage Vitals  Enc Vitals Group     BP      Pulse      Resp      Temp      Temp src      SpO2      Weight      Height      Head Circumference      Peak Flow      Pain Score      Pain Loc      Pain Edu?      Excl. in [place]?    No data found.  Updated Vital Signs There were no vitals taken for this visit.  Visual Acuity Right Eye Distance:   Left Eye Distance:   Bilateral Distance:    Right Eye Near:   Left Eye Near:    Bilateral Near:     Physical Exam   UC Treatments / Results  Labs (all labs ordered are listed, but only abnormal results are displayed) Labs Reviewed - No data to display  EKG   Radiology No results found.  Procedures Procedures (including critical care time)  Medications Ordered in UC Medications - No data to display  Initial Impression / Assessment and Plan / UC Course  I have reviewed the triage vital signs and the nursing  notes.  Pertinent labs & imaging results that were available during my care of the patient were reviewed by me and considered in my medical decision making (see chart for details).   Suspect acute bacterial rhinosinusitis secondary to viral sinusitis.  Previous treatment with azithromycin was likely unsuccessful because of viral etiology at that time.  Will  treat today with course of Augmentin and corticosteroid to relieve sinus inflammation.   Final Clinical Impressions(s) / UC Diagnoses   Final diagnoses:  None   Discharge Instructions   None    ED Prescriptions   None    PDMP not reviewed this encounter.   Rose Phi, [place] 08/23/22 1719

## 2022-08-23 NOTE — ED Triage Notes (Signed)
Pt. States that for the past 4 days she has been experiencing sinus pressure, ear pain and a headache. Pt. States using a Z-pack to treat her symptoms which relieves them temporarily.

## 2023-03-07 ENCOUNTER — Other Ambulatory Visit: Payer: Self-pay

## 2023-03-07 ENCOUNTER — Encounter: Payer: Self-pay | Admitting: Emergency Medicine

## 2023-03-07 DIAGNOSIS — E871 Hypo-osmolality and hyponatremia: Secondary | ICD-10-CM | POA: Diagnosis present

## 2023-03-07 DIAGNOSIS — Z79899 Other long term (current) drug therapy: Secondary | ICD-10-CM

## 2023-03-07 DIAGNOSIS — E861 Hypovolemia: Secondary | ICD-10-CM | POA: Diagnosis present

## 2023-03-07 DIAGNOSIS — Z8249 Family history of ischemic heart disease and other diseases of the circulatory system: Secondary | ICD-10-CM

## 2023-03-07 DIAGNOSIS — E119 Type 2 diabetes mellitus without complications: Secondary | ICD-10-CM | POA: Diagnosis present

## 2023-03-07 DIAGNOSIS — Z9049 Acquired absence of other specified parts of digestive tract: Secondary | ICD-10-CM

## 2023-03-07 DIAGNOSIS — Z90710 Acquired absence of both cervix and uterus: Secondary | ICD-10-CM

## 2023-03-07 DIAGNOSIS — N83202 Unspecified ovarian cyst, left side: Secondary | ICD-10-CM | POA: Diagnosis present

## 2023-03-07 DIAGNOSIS — G4700 Insomnia, unspecified: Secondary | ICD-10-CM | POA: Diagnosis present

## 2023-03-07 DIAGNOSIS — Z7984 Long term (current) use of oral hypoglycemic drugs: Secondary | ICD-10-CM

## 2023-03-07 DIAGNOSIS — K2970 Gastritis, unspecified, without bleeding: Secondary | ICD-10-CM | POA: Diagnosis present

## 2023-03-07 DIAGNOSIS — E876 Hypokalemia: Secondary | ICD-10-CM | POA: Diagnosis not present

## 2023-03-07 DIAGNOSIS — Z87891 Personal history of nicotine dependence: Secondary | ICD-10-CM

## 2023-03-07 DIAGNOSIS — R109 Unspecified abdominal pain: Secondary | ICD-10-CM | POA: Diagnosis not present

## 2023-03-07 DIAGNOSIS — N179 Acute kidney failure, unspecified: Principal | ICD-10-CM | POA: Diagnosis present

## 2023-03-07 DIAGNOSIS — Z9884 Bariatric surgery status: Secondary | ICD-10-CM

## 2023-03-07 DIAGNOSIS — Z90721 Acquired absence of ovaries, unilateral: Secondary | ICD-10-CM

## 2023-03-07 DIAGNOSIS — R809 Proteinuria, unspecified: Secondary | ICD-10-CM | POA: Diagnosis present

## 2023-03-07 DIAGNOSIS — Z1152 Encounter for screening for COVID-19: Secondary | ICD-10-CM

## 2023-03-07 DIAGNOSIS — Y990 Civilian activity done for income or pay: Secondary | ICD-10-CM

## 2023-03-07 DIAGNOSIS — I10 Essential (primary) hypertension: Secondary | ICD-10-CM | POA: Diagnosis present

## 2023-03-07 DIAGNOSIS — G43709 Chronic migraine without aura, not intractable, without status migrainosus: Secondary | ICD-10-CM | POA: Diagnosis present

## 2023-03-07 DIAGNOSIS — K219 Gastro-esophageal reflux disease without esophagitis: Secondary | ICD-10-CM | POA: Diagnosis present

## 2023-03-07 DIAGNOSIS — K449 Diaphragmatic hernia without obstruction or gangrene: Secondary | ICD-10-CM | POA: Diagnosis present

## 2023-03-07 NOTE — ED Triage Notes (Signed)
Patient ambulatory to triage with steady gait, without difficulty or distress noted; pt reports today having mid upper and lower abd pain radiating into back; denies any accomp symptoms; st hx of same with kidney failure

## 2023-03-08 ENCOUNTER — Inpatient Hospital Stay
Admission: EM | Admit: 2023-03-08 | Discharge: 2023-03-13 | DRG: 683 | Disposition: A | Discharge status: Home / Self Care | Payer: 59 | Attending: Internal Medicine | Admitting: Internal Medicine

## 2023-03-08 ENCOUNTER — Emergency Department: Payer: 59

## 2023-03-08 DIAGNOSIS — N10 Acute tubulo-interstitial nephritis: Secondary | ICD-10-CM | POA: Diagnosis present

## 2023-03-08 DIAGNOSIS — E871 Hypo-osmolality and hyponatremia: Secondary | ICD-10-CM | POA: Diagnosis present

## 2023-03-08 DIAGNOSIS — G4700 Insomnia, unspecified: Secondary | ICD-10-CM | POA: Diagnosis present

## 2023-03-08 DIAGNOSIS — E876 Hypokalemia: Secondary | ICD-10-CM | POA: Diagnosis not present

## 2023-03-08 DIAGNOSIS — K219 Gastro-esophageal reflux disease without esophagitis: Secondary | ICD-10-CM | POA: Diagnosis present

## 2023-03-08 DIAGNOSIS — N179 Acute kidney failure, unspecified: Principal | ICD-10-CM | POA: Diagnosis present

## 2023-03-08 DIAGNOSIS — G43719 Chronic migraine without aura, intractable, without status migrainosus: Secondary | ICD-10-CM | POA: Diagnosis present

## 2023-03-08 DIAGNOSIS — R109 Unspecified abdominal pain: Secondary | ICD-10-CM

## 2023-03-08 DIAGNOSIS — R509 Fever, unspecified: Secondary | ICD-10-CM | POA: Diagnosis not present

## 2023-03-08 DIAGNOSIS — R1084 Generalized abdominal pain: Secondary | ICD-10-CM | POA: Diagnosis present

## 2023-03-08 DIAGNOSIS — N83202 Unspecified ovarian cyst, left side: Secondary | ICD-10-CM | POA: Diagnosis present

## 2023-03-08 DIAGNOSIS — I10 Essential (primary) hypertension: Secondary | ICD-10-CM | POA: Diagnosis present

## 2023-03-08 LAB — COMPREHENSIVE METABOLIC PANEL
ALT: 14 U/L (ref 0–44)
AST: 17 U/L (ref 15–41)
Albumin: 4 g/dL (ref 3.5–5.0)
Alkaline Phosphatase: 68 U/L (ref 38–126)
Anion gap: 8 (ref 5–15)
BUN: 24 mg/dL — ABNORMAL HIGH (ref 6–20)
CO2: 22 mmol/L (ref 22–32)
Calcium: 8.7 mg/dL — ABNORMAL LOW (ref 8.9–10.3)
Chloride: 103 mmol/L (ref 98–111)
Creatinine, Ser: 1.53 mg/dL — ABNORMAL HIGH (ref 0.44–1.00)
GFR, Estimated: 43 mL/min — ABNORMAL LOW (ref >60)
Glucose, Bld: 93 mg/dL (ref 70–99)
Potassium: 4.1 mmol/L (ref 3.5–5.1)
Sodium: 133 mmol/L — ABNORMAL LOW (ref 135–145)
Total Bilirubin: 0.4 mg/dL (ref 0.3–1.2)
Total Protein: 6.9 g/dL (ref 6.5–8.1)

## 2023-03-08 LAB — URINALYSIS, ROUTINE W REFLEX MICROSCOPIC
Bilirubin Urine: NEGATIVE
Glucose, UA: NEGATIVE mg/dL
Hgb urine dipstick: NEGATIVE
Ketones, ur: NEGATIVE mg/dL
Leukocytes,Ua: NEGATIVE
Nitrite: NEGATIVE
Protein, ur: 30 mg/dL — AB
Specific Gravity, Urine: 1.004 — ABNORMAL LOW (ref 1.005–1.030)
pH: 6 (ref 5.0–8.0)

## 2023-03-08 LAB — CBC WITH DIFFERENTIAL/PLATELET
Abs Immature Granulocytes: 0.02 10*3/uL (ref 0.00–0.07)
Basophils Absolute: 0 10*3/uL (ref 0.0–0.1)
Basophils Relative: 1 %
Eosinophils Absolute: 0.1 10*3/uL (ref 0.0–0.5)
Eosinophils Relative: 1 %
HCT: 37 % (ref 36.0–46.0)
Hemoglobin: 12.7 g/dL (ref 12.0–15.0)
Immature Granulocytes: 0 %
Lymphocytes Relative: 24 %
Lymphs Abs: 1.7 10*3/uL (ref 0.7–4.0)
MCH: 32.8 pg (ref 26.0–34.0)
MCHC: 34.3 g/dL (ref 30.0–36.0)
MCV: 95.6 fL (ref 80.0–100.0)
Monocytes Absolute: 0.8 10*3/uL (ref 0.1–1.0)
Monocytes Relative: 10 %
Neutro Abs: 4.7 10*3/uL (ref 1.7–7.7)
Neutrophils Relative %: 64 %
Platelets: 231 10*3/uL (ref 150–400)
RBC: 3.87 MIL/uL (ref 3.87–5.11)
RDW: 12.2 % (ref 11.5–15.5)
WBC: 7.3 10*3/uL (ref 4.0–10.5)
nRBC: 0 % (ref 0.0–0.2)

## 2023-03-08 LAB — PROTEIN / CREATININE RATIO, URINE
Creatinine, Urine: 40 mg/dL
Total Protein, Urine: <6 mg/dL

## 2023-03-08 LAB — HEMOGLOBIN A1C
Hgb A1c MFr Bld: 5.3 % (ref 4.8–5.6)
Mean Plasma Glucose: 105 mg/dL

## 2023-03-08 LAB — LIPASE, BLOOD: Lipase: 41 U/L (ref 11–51)

## 2023-03-08 LAB — GLUCOSE, CAPILLARY
Glucose-Capillary: 83 mg/dL (ref 70–99)
Glucose-Capillary: 75 mg/dL (ref 70–99)

## 2023-03-08 MED ORDER — SODIUM CHLORIDE 0.9 % IV SOLN: INTRAVENOUS | Status: DC

## 2023-03-08 MED ORDER — PANTOPRAZOLE SODIUM 40 MG PO TBEC
40.0000 mg | DELAYED_RELEASE_TABLET | Freq: Every day | ORAL | Status: DC
  Administered 2023-03-08: 40 mg via ORAL
  Filled 2023-03-08: qty 1

## 2023-03-08 MED ORDER — BUTALBITAL-APAP-CAFFEINE 50-325-40 MG PO TABS
1.0000 | ORAL_TABLET | ORAL | Status: DC | PRN
  Administered 2023-03-10 – 2023-03-13 (×5): 2 via ORAL
  Filled 2023-03-08 (×5): qty 2

## 2023-03-08 MED ORDER — HYDROMORPHONE HCL 1 MG/ML IJ SOLN
0.5000 mg | INTRAMUSCULAR | Status: DC | PRN
  Administered 2023-03-08 – 2023-03-09 (×6): 1 mg via INTRAVENOUS
  Filled 2023-03-08 (×6): qty 1

## 2023-03-08 MED ORDER — ONDANSETRON HCL 4 MG/2ML IJ SOLN
4.0000 mg | Freq: Four times a day (QID) | INTRAMUSCULAR | Status: DC | PRN
  Administered 2023-03-09 – 2023-03-12 (×8): 4 mg via INTRAVENOUS
  Filled 2023-03-08 (×8): qty 2

## 2023-03-08 MED ORDER — SUMATRIPTAN SUCCINATE 50 MG PO TABS: 50.0000 mg | ORAL_TABLET | Freq: Once | ORAL | Status: DC | PRN

## 2023-03-08 MED ORDER — CETIRIZINE HCL 10 MG PO TABS
5.0000 mg | ORAL_TABLET | ORAL | Status: DC
  Administered 2023-03-09 – 2023-03-13 (×4): 5 mg via ORAL
  Filled 2023-03-08 (×5): qty 1

## 2023-03-08 MED ORDER — CYCLOBENZAPRINE HCL 10 MG PO TABS
10.0000 mg | ORAL_TABLET | Freq: Three times a day (TID) | ORAL | Status: DC | PRN
  Filled 2023-03-08: qty 1

## 2023-03-08 MED ORDER — ONDANSETRON HCL 4 MG/2ML IJ SOLN
4.0000 mg | Freq: Once | INTRAMUSCULAR | Status: AC
  Administered 2023-03-08: 4 mg via INTRAVENOUS
  Filled 2023-03-08: qty 2

## 2023-03-08 MED ORDER — ALBUTEROL SULFATE (2.5 MG/3ML) 0.083% IN NEBU: 3.0000 mL | INHALATION_SOLUTION | Freq: Four times a day (QID) | RESPIRATORY_TRACT | Status: DC | PRN

## 2023-03-08 MED ORDER — HYDROMORPHONE HCL 1 MG/ML IJ SOLN
1.0000 mg | Freq: Once | INTRAMUSCULAR | Status: AC
  Administered 2023-03-08: 1 mg via INTRAVENOUS
  Filled 2023-03-08: qty 1

## 2023-03-08 MED ORDER — HYDROCODONE-ACETAMINOPHEN 5-325 MG PO TABS
1.0000 | ORAL_TABLET | ORAL | Status: DC | PRN
  Administered 2023-03-08 – 2023-03-09 (×2): 2 via ORAL
  Filled 2023-03-08 (×2): qty 2

## 2023-03-08 MED ORDER — DIPHENHYDRAMINE HCL 25 MG PO CAPS
25.0000 mg | ORAL_CAPSULE | Freq: Four times a day (QID) | ORAL | Status: DC | PRN
  Administered 2023-03-08 – 2023-03-09 (×2): 25 mg via ORAL
  Filled 2023-03-08 (×2): qty 1

## 2023-03-08 MED ORDER — ONDANSETRON HCL 4 MG PO TABS
4.0000 mg | ORAL_TABLET | Freq: Four times a day (QID) | ORAL | Status: DC | PRN
  Administered 2023-03-08 – 2023-03-13 (×5): 4 mg via ORAL
  Filled 2023-03-08 (×6): qty 1

## 2023-03-08 MED ORDER — SENNOSIDES-DOCUSATE SODIUM 8.6-50 MG PO TABS: 1.0000 | ORAL_TABLET | Freq: Every evening | ORAL | Status: DC | PRN

## 2023-03-08 MED ORDER — ACETAMINOPHEN 650 MG RE SUPP: 650.0000 mg | Freq: Four times a day (QID) | RECTAL | Status: DC | PRN

## 2023-03-08 MED ORDER — BISACODYL 5 MG PO TBEC: 5.0000 mg | DELAYED_RELEASE_TABLET | Freq: Every day | ORAL | Status: DC | PRN

## 2023-03-08 MED ORDER — ACETAMINOPHEN 325 MG PO TABS
650.0000 mg | ORAL_TABLET | Freq: Four times a day (QID) | ORAL | Status: DC | PRN
  Filled 2023-03-08: qty 2

## 2023-03-08 MED ORDER — TOPIRAMATE 25 MG PO TABS
50.0000 mg | ORAL_TABLET | Freq: Every day | ORAL | Status: DC
  Administered 2023-03-08 – 2023-03-13 (×6): 50 mg via ORAL
  Filled 2023-03-08 (×6): qty 2

## 2023-03-08 MED ORDER — INSULIN ASPART 100 UNIT/ML IJ SOLN: 0.0000 [IU] | Freq: Three times a day (TID) | INTRAMUSCULAR | Status: DC

## 2023-03-08 MED ORDER — ZOLPIDEM TARTRATE 5 MG PO TABS
5.0000 mg | ORAL_TABLET | Freq: Every evening | ORAL | Status: DC | PRN
  Administered 2023-03-09 – 2023-03-13 (×4): 5 mg via ORAL
  Filled 2023-03-08 (×4): qty 1

## 2023-03-08 MED ORDER — SODIUM CHLORIDE 0.9 % IV BOLUS (SEPSIS)
1000.0000 mL | Freq: Once | INTRAVENOUS | Status: AC
  Administered 2023-03-08: 1000 mL via INTRAVENOUS

## 2023-03-08 MED ORDER — ENOXAPARIN SODIUM 40 MG/0.4ML IJ SOSY
40.0000 mg | PREFILLED_SYRINGE | INTRAMUSCULAR | Status: DC
  Administered 2023-03-08 – 2023-03-13 (×6): 40 mg via SUBCUTANEOUS
  Filled 2023-03-08 (×6): qty 0.4

## 2023-03-08 NOTE — Assessment & Plan Note (Addendum)
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  Flank pain is no worse on left than right per patient, so unlikely this contributes to her acute illness.   --On call Gynecologist recommended outpatient follow up

## 2023-03-08 NOTE — Assessment & Plan Note (Signed)
Continue home Ambien

## 2023-03-08 NOTE — Progress Notes (Signed)
PHARMACIST - PHYSICIAN ORDER COMMUNICATION  CONCERNING: P&T Medication Policy for Ambien (Zolpidem)  DESCRIPTION:  This patient's order for:  Ambien (Zolpidem) 10mg  QHS  has been noted.  For female patients and patients age 44 years or older, dosage of zolpidem automatically limited to 5 mg.  ACTION TAKEN: The pharmacy department has adjusted the dose of Zolpidem to 5mg  per policy.   Pernell Dupre, PharmD, BCPS Clinical Pharmacist 03/08/2023 2:58 PM

## 2023-03-08 NOTE — Assessment & Plan Note (Addendum)
Hold home antihypertensives for now, BP's are normal and soft at times.  Appears home regimen is amiloride 5 mg daily, atenolol 100 mg daily, lisinopril/HCTZ 20-25 mg daily, propranolol ER 60 mg daily. --Monitor BP's  --Resume atenolol, continue holding others for now & resume as indicated

## 2023-03-08 NOTE — H&P (Signed)
History and Physical    Patient: Patricia Gonzalez [id number] DOB: 12/21/1980 DOA: 03/08/2023 DOS: the patient was seen and examined on 03/08/2023 PCP: Donnie Coffin, MD  Patient coming from: Home  Chief Complaint:  Chief Complaint  Patient presents with   Abdominal Pain   HPI: [name] is a 42 y.o. female with medical history significant of hypertension, GERD, s/p Roux-en-Y, migraines who presented to the ED for evaluation of bilateral flank and generalized abdominal pain with nausea. No vomiting or fever/chills.  Also reports poor PO intake due to the nausea.  She reports this illness resembles what she experienced in early 2020 when she presented with similar symptoms and was found to have AKI and signs of inflammation of bilateral kidneys on imaging.  She was seen by nephrology and followed up.  Has been doing well in the interim.  Reports significant intentional weight loss and no longer has diabetes, requests to avoid finger sticks and insulin unless it is needed.  She denies other recent illnesses, exposure to foods or substance that could cause GI symptoms, no medication changes.  Denies cough, congestion, sore throat, dysuria, or other symptoms.  ED course -- vitals were normal.  Labs were notable for sodium 133, BUN 24 and Cr 1.53 (baseline in Feb 2023 was 0.72).  No leukocytosis on CBC.  CT renal stone protocol showed bilateral perinephric stranding concerning for possible pyelonephritis vs AKI.  Urinalysis was without signs of infection but sent for culture.  The CT report also notes a Left ovarian dermoid cyst which increased from 2cm to 4cm.  Of note, pt reports she is s/p hysterectomy and bilateral oophorectomy.    Patient admitted for further evaluation. Started on IV fluids, IV antiemetics and pain management.   Review of Systems: As mentioned in the history of present illness. All other systems reviewed and are negative.   Past Medical History:  Diagnosis Date    GERD (gastroesophageal reflux disease)    Headache    Hypertension    Past Surgical History:  Procedure Laterality Date   CHOLECYSTECTOMY     COLONOSCOPY     COLONOSCOPY WITH PROPOFOL N/A 12/09/2018   Procedure: COLONOSCOPY WITH PROPOFOL;  Surgeon: Lin Landsman, MD;  Location: [facility] ENDOSCOPY;  Service: Gastroenterology;  Laterality: N/A;   CYST EXCISION     LAPAROSCOPIC GASTRIC SLEEVE RESECTION WITH HIATAL HERNIA REPAIR  01/02/2017   Procedure: LAPAROSCOPIC GASTRIC SLEEVE RESECTION WITH HIATAL HERNIA REPAIR;  Surgeon: Bonner Puna, MD;  Location: [facility] ORS;  Service: General;;   Social History:  reports that she quit smoking about 7 years ago. Her smoking use included cigarettes. She has a 7.50 pack-year smoking history. She has never used smokeless tobacco. She reports that she does not drink alcohol and does not use drugs.  Allergies  Allergen Reactions   Ketorolac Tromethamine Anaphylaxis    Shots   Morphine Other (See Comments)    Caused stomach pain    Family History  Problem Relation Age of Onset   CAD Father    Lung cancer Father     Prior to Admission medications   Medication Sig Start Date End Date Taking? Authorizing Provider  [id number] Take 15 mL every 4 hours by oral route. 01/09/17   [provider]  albuterol (VENTOLIN HFA) 108 (90 Base) MCG/ACT inhaler Inhale into the lungs. 05/16/22   [provider]  aMILoride (MIDAMOR) 5 MG tablet Take 5 mg by mouth daily. 05/25/22   [provider]  amoxicillin-clavulanate (AUGMENTIN) 875-125 MG tablet Take 1 tablet by mouth every 12 (twelve) hours. 08/23/22   Immordino, Annie Main, FNP  atenolol (TENORMIN) 100 MG tablet Take 1 tablet by mouth daily.    [provider]  BD PEN NEEDLE NANO 2ND GEN 32G X 4 MM MISC Inject into the skin. 03/16/22   [provider]  butalbital-acetaminophen-caffeine (FIORICET, ESGIC) 50-325-40 MG tablet Take 1-2 tablets by mouth every 4 (four) hours as needed for  headache.    [provider]  chlorpheniramine-HYDROcodone (TUSSIONEX) 10-8 MG/5ML take 5 milliliters by mouth every 12 hours if needed for cough    [provider]  cyclobenzaprine (FLEXERIL) 10 MG tablet Take 10 mg by mouth every 8 (eight) hours as needed for muscle spasms.    [provider]  desogestrel-ethinyl estradiol (RECLIPSEN) 0.15-30 MG-MCG tablet Take 1 tablet by mouth daily.    [provider]  dexlansoprazole (DEXILANT) 60 MG capsule Take 60 mg by mouth daily.    [provider]  ibuprofen (IBU) 800 MG tablet take 1 tablet by mouth every 8 hours if needed for pain 03/16/22   [provider]  influenza vac split quadrivalent PF (FLUARIX) 0.5 ML injection inject 0.5 milliliter intramuscularly    [provider]  levocetirizine (XYZAL) 5 MG tablet Take 5 mg by mouth every morning.    [provider]  lisinopril-hydrochlorothiazide (PRINZIDE,ZESTORETIC) 20-25 MG tablet Take 1 tablet by mouth daily.    [provider]  metFORMIN (GLUCOPHAGE-XR) 500 MG 24 hr tablet take 2 tablets by mouth every morning and 1 tablet every evening    [provider]  methocarbamol (ROBAXIN) 500 MG tablet Take 1 tablet (500 mg total) by mouth 2 (two) times daily. 08/05/21   Boddu, Erasmo Downer, FNP  methocarbamol (ROBAXIN) 500 MG tablet Take by mouth. 01/06/22   [provider]  methylPREDNISolone (MEDROL DOSEPAK) 4 MG TBPK tablet Steroid taper. Take as directed by packaging. 08/23/22   Immordino, Annie Main, FNP  MOUNJARO 5 MG/0.5ML Pen SMARTSIG:0.5 Milliliter(s) SUB-Q Once a Week 07/06/22   [provider]  norethindrone (MICRONOR) 0.35 MG tablet Take 1 tablet by mouth daily.    [provider]  omeprazole (PRILOSEC) 40 MG capsule take 1 capsule by mouth once daily for REFLUX    [provider]  ondansetron (ZOFRAN ODT) 4 MG disintegrating tablet Take 1 tablet (4 mg total) by mouth every 4 (four) hours  as needed for nausea or vomiting. 01/02/17   Mardelle Matte, PA-C  oxyCODONE (OXY IR/ROXICODONE) 5 MG immediate release tablet Take by mouth. 03/16/22   [provider]  PREVIDENT 5000 SENSITIVE 1.1-5 % PSTE BRUSH WITH PREVIDENT 2 TIMES A DAY 10/11/18   [provider]  propranolol (INDERAL) 60 MG tablet Take 60 mg by mouth every morning.    [provider]  rizatriptan (MAXALT-MLT) 10 MG disintegrating tablet rizatriptan 10 mg disintegrating tablet  take 1 tablet by mouth ONCE AS NEEDED FOR MIGRAINE FOR UP TO 1 DO...  (REFER TO PRESCRIPTION NOTES). 01/08/17   [provider]  silver sulfADIAZINE (SSD) 1 % cream apply to affected area once daily    [provider]  SUMAtriptan (IMITREX) 100 MG tablet Take 100 mg by mouth every 2 (two) hours as needed for migraine. May repeat in 2 hours if headache persists or recurs.    [provider]  SUMAtriptan (IMITREX) 100 MG tablet Take by mouth. 03/07/17   [provider]  topiramate (TOPAMAX)  25 MG capsule     [provider]  topiramate (TOPAMAX) 25 MG tablet Take 25 mg by mouth 2 (two) times daily. 09/24/18   [provider]  tretinoin (RETIN-A) 0.025 % cream tretinoin 0.025 % topical cream    [provider]  valACYclovir (VALTREX) 1000 MG tablet Take by mouth. 04/11/22   [provider]  valACYclovir (VALTREX) 500 MG tablet Take 2,000 mg by mouth 2 (two) times daily as needed.    [provider]  verapamil (CALAN-SR) 180 MG CR tablet take 1 tablet by mouth once daily for hypertension and migraines    [provider]  VICTOZA 18 MG/3ML SOPN Inject into the skin. 07/25/22   [provider]  WEGOVY 2.4 MG/0.75ML SOAJ SMARTSIG:2.4 Milligram(s) SUB-Q Once a Week 06/14/22   [provider]  zolpidem (AMBIEN) 10 MG tablet TK 1 T PO IMMEDIATELY BEFORE BEDTIME FOR SLEEP 09/19/18   [provider]    Physical Exam: Vitals:    03/08/23 1000 03/08/23 1100 03/08/23 1300 03/08/23 1430  BP: 102/60 99/60 117/75 122/72  Pulse: 85 86 81 87  Resp: 16 20 20 15   Temp:    97.6 F (36.4 C)  TempSrc:    Oral  SpO2: 96% 96% 100% 100%  Weight:      Height:       General exam: awake, alert, appears in mild distress from pain HEENT: atraumatic, clear conjunctiva, anicteric sclera, moist mucus membranes, hearing grossly normal  Respiratory system: CTAB, no wheezes, rales or rhonchi, normal respiratory effort. Cardiovascular system: normal S1/S2, RRR, no JVD, murmurs, rubs, gallops, no pedal edema.   Gastrointestinal system: soft, mildly tender on palpation with no guarding or rebound tenderness, no HSM felt, +bowel sounds. Central nervous system: A&O x4. no gross focal neurologic deficits, normal speech Extremities: moves all, no edema, normal tone Skin: dry, intact, normal temperature, normal color, No rashes, lesions or ulcers Psychiatry: normal mood, congruent affect, judgement and insight appear normal   Data Reviewed:  Notable labs as reviewed above.  Assessment and Plan: * AKI (acute kidney injury) Cr 1.53 on admission. Baseline Cr 0.72 in Feb 2023. Hx of admission in 2020 for AKI and similar presenting complaints. --Nephrology consulted --Continue IV fluids --Monitor BMP --Avoid nephrotoxins, renally dose meds  Insomnia Continue home Ambien  GERD (gastroesophageal reflux disease) Continue PPI  Ovarian cyst, left Left ovarian dermoid cyst seen on CT renal stone study, doubled in size from 2 >> 4 cm in four years.  Pt reports hx of hysterectomy and b/l oophorectomy.  Flank pain is no worse on left than right per patient, so unlikely this contributes to her acute illness. --Will ask Gynecology's input  Acute pyelonephritis (Possible, not completely ruled out). UA was not showing infection. --Follow urine culture --Avoid antibiotics for now given low suspicion for infection without fevers, leukocytosis,  abnormal UA --Monitor clinically for s/sx's of infection  Intractable chronic migraine without aura and without status migrainosus Continue daily Topamax, PRN Maxalt or PRN Fioricet  Essential hypertension Hold home antihypertensives for now, BP's are normal and soft at times.  Appears home regimen is amiloride 5 mg daily, atenolol 100 mg daily, lisinopril/HCTZ 20-25 mg daily, propranolol ER 60 mg daily. --Monitor BP's and resume meds as indicated      Advance Care Planning:   Code Status: Full Code   Consults: Nephrology  Family Communication: None, pt is able to update.  Severity of Illness: The appropriate patient status for this patient  is OBSERVATION. Observation status is judged to be reasonable and necessary in order to provide the required intensity of service to ensure the patient's safety. The patient's presenting symptoms, physical exam findings, and initial radiographic and laboratory data in the context of their medical condition is felt to place them at decreased risk for further clinical deterioration. Furthermore, it is anticipated that the patient will be medically stable for discharge from the hospital within 2 midnights of admission.   Author: Ezekiel Slocumb, DO 03/08/2023 2:53 PM  For on call review [email or web address].

## 2023-03-08 NOTE — Assessment & Plan Note (Addendum)
Ruled Out.  UA benign and urine culture no growth. See AKI

## 2023-03-08 NOTE — Assessment & Plan Note (Addendum)
Cr 1.53 on admission. Baseline Cr 0.72 in Feb 2023. Hx of admission in 2020 for AKI and similar presenting complaints. Cr improved to 0.89 with IV fluids --Nephrology consulted --Monitor off IV fluids --Monitor BMP --Avoid nephrotoxins, renally dose meds

## 2023-03-08 NOTE — Assessment & Plan Note (Addendum)
Using IV PPI for now, pt states PO Protonix not effective for her.  Her home PPI not on formulary.

## 2023-03-08 NOTE — ED Provider Notes (Signed)
[facility] Provider Note    Event Date/Time   First MD Initiated Contact with Patient 03/08/23 [phone]     (approximate)   History   Abdominal Pain   HPI  Patricia Gonzalez is a 42 y.o. female with history of hypertension, GERD who presents to the emergency department with diffuse abdominal pain and bilateral flank pain.  Has had nausea but no vomiting or diarrhea.  Reports decreased oral intake today due to the nausea.  No fevers, dysuria, hematuria, vaginal bleeding or discharge.  She has had previous cholecystectomy, gastric sleeve, hiatal hernia repair and hysterectomy.  Reports she had similar symptoms when she was admitted to the hospital in 2020 and was found to have acute renal failure.  Seems like the cause of her renal failure at that time was unclear and could have been possibly due to glomerulonephritis versus pyelonephritis.  History provided by patient.    Past Medical History:  Diagnosis Date   GERD (gastroesophageal reflux disease)    Headache    Hypertension     Past Surgical History:  Procedure Laterality Date   CHOLECYSTECTOMY     COLONOSCOPY     COLONOSCOPY WITH PROPOFOL N/A 12/09/2018   Procedure: COLONOSCOPY WITH PROPOFOL;  Surgeon: Lin Landsman, MD;  Location: [facility] ENDOSCOPY;  Service: Gastroenterology;  Laterality: N/A;   CYST EXCISION     LAPAROSCOPIC GASTRIC SLEEVE RESECTION WITH HIATAL HERNIA REPAIR  01/02/2017   Procedure: LAPAROSCOPIC GASTRIC SLEEVE RESECTION WITH HIATAL HERNIA REPAIR;  Surgeon: Bonner Puna, MD;  Location: [facility] ORS;  Service: General;;    MEDICATIONS:  Prior to Admission medications   Medication Sig Start Date End Date Taking? Authorizing Provider  [id number] Take 15 mL every 4 hours by oral route. 01/09/17   [provider]  albuterol (VENTOLIN HFA) 108 (90 Base) MCG/ACT inhaler Inhale into the lungs. 05/16/22   [provider]  aMILoride (MIDAMOR) 5 MG tablet Take 5 mg by mouth daily.  05/25/22   [provider]  amoxicillin-clavulanate (AUGMENTIN) 875-125 MG tablet Take 1 tablet by mouth every 12 (twelve) hours. 08/23/22   Immordino, Annie Main, FNP  atenolol (TENORMIN) 100 MG tablet Take 1 tablet by mouth daily.    [provider]  BD PEN NEEDLE NANO 2ND GEN 32G X 4 MM MISC Inject into the skin. 03/16/22   [provider]  butalbital-acetaminophen-caffeine (FIORICET, ESGIC) 50-325-40 MG tablet Take 1-2 tablets by mouth every 4 (four) hours as needed for headache.    [provider]  chlorpheniramine-HYDROcodone (TUSSIONEX) 10-8 MG/5ML take 5 milliliters by mouth every 12 hours if needed for cough    [provider]  cyclobenzaprine (FLEXERIL) 10 MG tablet Take 10 mg by mouth every 8 (eight) hours as needed for muscle spasms.    [provider]  desogestrel-ethinyl estradiol (RECLIPSEN) 0.15-30 MG-MCG tablet Take 1 tablet by mouth daily.    [provider]  dexlansoprazole (DEXILANT) 60 MG capsule Take 60 mg by mouth daily.    [provider]  ibuprofen (IBU) 800 MG tablet take 1 tablet by mouth every 8 hours if needed for pain 03/16/22   [provider]  influenza vac split quadrivalent PF (FLUARIX) 0.5 ML injection inject 0.5 milliliter intramuscularly    [provider]  levocetirizine (XYZAL) 5 MG tablet Take 5 mg by mouth every morning.    [provider]  lisinopril-hydrochlorothiazide (PRINZIDE,ZESTORETIC) 20-25 MG tablet Take 1 tablet by mouth daily.    [provider]  metFORMIN (GLUCOPHAGE-XR) 500 MG 24 hr tablet take 2 tablets by mouth every morning and 1 tablet every evening    [provider]  methocarbamol (ROBAXIN) 500 MG tablet Take 1 tablet (500 mg total) by mouth 2 (two) times daily. 08/05/21   Boddu, Erasmo Downer, FNP  methocarbamol (ROBAXIN) 500 MG tablet Take by mouth. 01/06/22   [provider]  methylPREDNISolone (MEDROL DOSEPAK) 4 MG TBPK tablet  Steroid taper. Take as directed by packaging. 08/23/22   Immordino, Annie Main, FNP  MOUNJARO 5 MG/0.5ML Pen SMARTSIG:0.5 Milliliter(s) SUB-Q Once a Week 07/06/22   [provider]  norethindrone (MICRONOR) 0.35 MG tablet Take 1 tablet by mouth daily.    [provider]  omeprazole (PRILOSEC) 40 MG capsule take 1 capsule by mouth once daily for REFLUX    [provider]  ondansetron (ZOFRAN ODT) 4 MG disintegrating tablet Take 1 tablet (4 mg total) by mouth every 4 (four) hours as needed for nausea or vomiting. 01/02/17   Mardelle Matte, PA-C  oxyCODONE (OXY IR/ROXICODONE) 5 MG immediate release tablet Take by mouth. 03/16/22   [provider]  PREVIDENT 5000 SENSITIVE 1.1-5 % PSTE BRUSH WITH PREVIDENT 2 TIMES A DAY 10/11/18   [provider]  propranolol (INDERAL) 60 MG tablet Take 60 mg by mouth every morning.    [provider]  rizatriptan (MAXALT-MLT) 10 MG disintegrating tablet rizatriptan 10 mg disintegrating tablet  take 1 tablet by mouth ONCE AS NEEDED FOR MIGRAINE FOR UP TO 1 DO...  (REFER TO PRESCRIPTION NOTES). 01/08/17   [provider]  silver sulfADIAZINE (SSD) 1 % cream apply to affected area once daily    [provider]  SUMAtriptan (IMITREX) 100 MG tablet Take 100 mg by mouth every 2 (two) hours as needed for migraine. May repeat in 2 hours if headache persists or recurs.    [provider]  SUMAtriptan (IMITREX) 100 MG tablet Take by mouth. 03/07/17   [provider]  topiramate (TOPAMAX) 25 MG capsule     [provider]  topiramate (TOPAMAX) 25 MG tablet Take 25 mg by mouth 2 (two) times daily. 09/24/18   [provider]  tretinoin (RETIN-A) 0.025 % cream tretinoin 0.025 % topical cream    [provider]  valACYclovir (VALTREX) 1000 MG tablet Take by mouth. 04/11/22   [provider]  valACYclovir (VALTREX) 500 MG tablet Take 2,000 mg by mouth 2 (two) times daily as  needed.    [provider]  verapamil (CALAN-SR) 180 MG CR tablet take 1 tablet by mouth once daily for hypertension and migraines    [provider]  VICTOZA 18 MG/3ML SOPN Inject into the skin. 07/25/22   [provider]  WEGOVY 2.4 MG/0.75ML SOAJ SMARTSIG:2.4 Milligram(s) SUB-Q Once a Week 06/14/22   [provider]  zolpidem (AMBIEN) 10 MG tablet TK 1 T PO IMMEDIATELY BEFORE BEDTIME FOR SLEEP 09/19/18   [provider]    Physical Exam   Triage Vital Signs: ED Triage Vitals  Enc Vitals Group     BP 03/08/23 0003 118/76     Pulse Rate 03/08/23 0003 82     Resp 03/08/23 0003 18     Temp 03/08/23 0003 97.7 F (36.5 C)     Temp Source 03/08/23 0003 Oral     SpO2 03/08/23 0003 99 %     Weight 03/07/23 2358 155 lb (70.3 kg)     Height 03/07/23 2358 5'  5" (1.651 m)     Head Circumference --      Peak Flow --      Pain Score 03/07/23 2358 10     Pain Loc --      Pain Edu? --      Excl. in [place]? --     Most recent vital signs: Vitals:   03/08/23 0003 03/08/23 0339  BP: 118/76 122/70  Pulse: 82 80  Resp: 18 16  Temp: 97.7 F (36.5 C) 97.9 F (36.6 C)  SpO2: 99% 99%    CONSTITUTIONAL: Alert, responds appropriately to questions.  Appears uncomfortable HEAD: Normocephalic, atraumatic EYES: Conjunctivae clear, pupils appear equal, sclera nonicteric ENT: normal nose; moist mucous membranes NECK: Supple, normal ROM CARD: RRR; S1 and S2 appreciated RESP: Normal chest excursion without splinting or tachypnea; breath sounds clear and equal bilaterally; no wheezes, no rhonchi, no rales, no hypoxia or respiratory distress, speaking full sentences ABD/GI: Non-distended; soft, diffusely tender throughout the abdomen without guarding or rebound BACK: The back appears normal, no CVA tenderness, no midline spinal tenderness or step-off or deformity EXT: Normal ROM in all joints; no deformity noted, no edema SKIN: Normal color for age and race;  warm; no rash on exposed skin NEURO: Moves all extremities equally, normal speech, normal gait PSYCH: The patient's mood and manner are appropriate.   ED Results / Procedures / Treatments   LABS: (all labs ordered are listed, but only abnormal results are displayed) Labs Reviewed  COMPREHENSIVE METABOLIC PANEL - Abnormal; Notable for the following components:      Result Value   Sodium 133 (*)    BUN 24 (*)    Creatinine, Ser 1.53 (*)    Calcium 8.7 (*)    GFR, Estimated 43 (*)    All other components within normal limits  URINALYSIS, ROUTINE W REFLEX MICROSCOPIC - Abnormal; Notable for the following components:   Color, Urine STRAW (*)    APPearance CLEAR (*)    Specific Gravity, Urine 1.004 (*)    Protein, ur 30 (*)    Bacteria, UA RARE (*)    All other components within normal limits  URINE CULTURE  CBC WITH DIFFERENTIAL/PLATELET  LIPASE, BLOOD     EKG:  EKG Interpretation  Date/Time:  Thursday March 08 2023 00:08:38 EDT Ventricular Rate:  78 PR Interval:  138 QRS Duration: 78 QT Interval:  370 QTC Calculation: 421 R Axis:   61 Text Interpretation: Normal sinus rhythm Normal ECG No previous ECGs available Confirmed by Pryor Curia [phone]) on 03/08/2023 3:18:55 AM         RADIOLOGY: My personal review and interpretation of imaging: CT shows bilateral low perinephric stranding.  I have personally reviewed all radiology reports.   CT Renal Stone Study  Result Date: 03/08/2023 CLINICAL DATA:  Flank pain. Stone suspected. History of kidney failure. EXAM: CT ABDOMEN AND PELVIS WITHOUT CONTRAST TECHNIQUE: Multidetector CT imaging of the abdomen and pelvis was performed following the standard protocol without IV contrast. RADIATION DOSE REDUCTION: This exam was performed according to the departmental dose-optimization program which includes automated exposure control, adjustment of the mA and/or kV according to patient size and/or use of iterative reconstruction  technique. COMPARISON:  CT with IV contrast 01/03/2019 FINDINGS: Lower chest: No acute abnormality. Hepatobiliary: No focal liver abnormality is seen without contrast. Status post cholecystectomy. No biliary dilatation. Pancreas: Unremarkable without contrast. Spleen: Unremarkable without contrast. Adrenals/Urinary Tract: There is no adrenal mass. There is no focal abnormality of unenhanced renal  cortex. There is diffuse bilateral perirenal stranding. This is similar to the prior study, and could simply be chronic change or could indicate pyelonephritis or acute kidney injury. There is no urinary stone or obstruction. The bladder is contracted and not well seen but does not show adjacent inflammatory change. Stomach/Bowel: Postsurgical change noted along the outer gastric wall suggesting a previous sleeve gastrectomy. The stomach is contracted. Small and large bowel and appendix demonstrate no dilatation or wall thickening. There are left colonic diverticula without evidence of diverticulitis. Vascular/Lymphatic: Aortic atherosclerosis. No enlarged abdominal or pelvic lymph nodes. Reproductive: The uterus is absent. Left ovarian dermoid was previously 2 cm and -64 Hounsfield units, today is 4 cm and -33 Hounsfield units effectively having doubled in size in 4 years. There are no complicating features along the wall. No right adnexal abnormality. Other: There is no free air, free fluid, free hemorrhage or incarcerated hernia. Musculoskeletal: There is spondylosis of the thoracic and lumbar spine, mild facet spurring. No primary pathologic bone lesion is seen. IMPRESSION: 1. Diffuse bilateral perirenal stranding similar to the prior study. This could simply be chronic change or could indicate pyelonephritis or acute kidney injury. 2. No urinary stone or obstruction. 3. Aortic atherosclerosis. 4. Diverticulosis without evidence of diverticulitis. 5. 4 cm left ovarian dermoid, previously 2 cm. Consider gyn consult.  Aortic Atherosclerosis (ICD10-I70.0). Electronically Signed   By: Telford Nab M.D.   On: 03/08/2023 04:43     PROCEDURES:  Critical Care performed: No      Procedures    IMPRESSION / MDM / ASSESSMENT AND PLAN / ED COURSE  I reviewed the triage vital signs and the nursing notes.    Patient here generalized abdominal pain, flank pain.  States history of similar presentation in 2020 with mild AKI and perinephric stranding concerning for possible pyelonephritis versus glomerulonephritis.    DIFFERENTIAL DIAGNOSIS (includes but not limited to):   Kidney stone, pyelonephritis, ascending UTI, musculoskeletal pain, dehydration, colitis, diverticulitis, appendicitis   Patient's presentation is most consistent with acute presentation with potential threat to life or bodily function.   PLAN: Workup initiated from triage.  No leukocytosis.  Minimally elevated creatinine of 1.53.  Normal LFTs and lipase.  Urine shows no sign of infection or blood today.  Does have some proteinuria.  Will obtain noncontrast CT of abdomen pelvis.  Will give IV fluids, pain and nausea medicine.   MEDICATIONS GIVEN IN ED: Medications  0.9 %  sodium chloride infusion (has no administration in time range)  HYDROmorphone (DILAUDID) injection 1 mg (has no administration in time range)  sodium chloride 0.9 % bolus 1,000 mL (1,000 mLs Intravenous New Bag/Given 03/08/23 0336)  sodium chloride 0.9 % bolus 1,000 mL (1,000 mLs Intravenous New Bag/Given 03/08/23 0336)  HYDROmorphone (DILAUDID) injection 1 mg (1 mg Intravenous Given 03/08/23 0336)  ondansetron (ZOFRAN) injection 4 mg (4 mg Intravenous Given 03/08/23 [id number])     ED COURSE: CT scan reviewed and interpreted by myself and the radiologist and shows again bilateral perinephric stranding that was present in 2020 when she had a similar presentation with AKI, flank pain and was seen by nephrology.  She states she did see them as an outpatient was started on  medication but never had a biopsy.  It appears per their notes they thought glomerulonephritis was less likely.  We discussed that pyelonephritis is also the differential but she has had no fevers, chills, dysuria and urine today does not appear infected but I have added on a  urine culture.  Will hold antibiotics and continue fluids.  Will admit to the hospitalist for nephrology consult.  Patient's pain is improved but not completely resolved.  Will give another dose of Dilaudid and avoid NSAIDs.   CONSULTS:  Consulted and discussed patient's case with hospitalist, Dr. Sidney Ace.  I have recommended admission and consulting physician agrees and will place admission orders.  Patient (and family if present) agree with this plan.   I reviewed all nursing notes, vitals, pertinent previous records.  All labs, EKGs, imaging ordered have been independently reviewed and interpreted by myself.    OUTSIDE RECORDS REVIEWED: Reviewed last admission in January 2020.       FINAL CLINICAL IMPRESSION(S) / ED DIAGNOSES   Final diagnoses:  AKI (acute kidney injury)     Rx / DC Orders   ED Discharge Orders     None        Note:  This document was prepared using Dragon voice recognition software and may include unintentional dictation errors.   Nnaemeka Samson, Delice Bison, DO 03/08/23 [phone]

## 2023-03-08 NOTE — Assessment & Plan Note (Signed)
Continue daily Topamax, PRN Maxalt or PRN Fioricet

## 2023-03-08 NOTE — Consult Note (Signed)
[facility]  CONSULT NOTE    Date: 03/08/2023                  Patient Name:  Patricia Gonzalez  MRN: [id number]  DOB: 1981-08-07  Age / Sex: 42 y.o., female         PCP: Donnie Coffin, MD                 Service Requesting Consult: [place]                 Reason for Consult: Acute kidney injury            History of Present Illness: Patricia Gonzalez is a 42 y.o.  female with past medical history of hypertension and GERD, who was admitted to [facility] on 03/08/2023 for AKI (acute kidney injury) [N17.9] Acute pyelonephritis [N10]  Patient is known to our practice from previous admissions. She was lost to follow up at outpatient office. She states she was at work when she began feeling a pain in her abdomen. She states it got worse and went around to her back. She reports a sharp pain on both sides. No history of kidney stones. Reports nausea without vomiting or diarrhea. Denies known fever or chills.   Labs on ED arrival significant for sodium 133, BUN 24, creatinine 1.53 with GFR 43. UA has small amount of protein. CT renal stone shows perirenal stranding which could indicate pyelonephritis.    Medications: Outpatient medications: (Not in a hospital admission)   Current medications: Current Facility-Administered Medications  Medication Dose Route Frequency Provider Last Rate Last Admin   0.9 %  sodium chloride infusion   Intravenous Continuous Ward, Delice Bison, DO 125 mL/hr at 03/08/23 0917 New Bag at 03/08/23 0917   0.9 %  sodium chloride infusion   Intravenous Continuous Ezekiel Slocumb, DO 75 mL/hr at 03/08/23 0916 New Bag at 03/08/23 0916   acetaminophen (TYLENOL) tablet 650 mg  650 mg Oral Q6H PRN Nicole Kindred A, DO       Or   acetaminophen (TYLENOL) suppository 650 mg  650 mg Rectal Q6H PRN Ezekiel Slocumb, DO       bisacodyl (DULCOLAX) EC tablet 5 mg  5 mg Oral Daily PRN Nicole Kindred A, DO       enoxaparin (LOVENOX) injection 40 mg  40 mg  Subcutaneous Q24H Nicole Kindred A, DO   40 mg at 03/08/23 [id number]   HYDROcodone-acetaminophen (NORCO/VICODIN) 5-325 MG per tablet 1-2 tablet  1-2 tablet Oral Q4H PRN Nicole Kindred A, DO       HYDROmorphone (DILAUDID) injection 0.5-1 mg  0.5-1 mg Intravenous Q2H PRN Nicole Kindred A, DO   1 mg at 03/08/23 1314   ondansetron (ZOFRAN) tablet 4 mg  4 mg Oral Q6H PRN Nicole Kindred A, DO   4 mg at 03/08/23 [id number]   Or   ondansetron (ZOFRAN) injection 4 mg  4 mg Intravenous Q6H PRN Nicole Kindred A, DO       senna-docusate (Senokot-S) tablet 1 tablet  1 tablet Oral QHS PRN Ezekiel Slocumb, DO       Current Outpatient Medications  Medication Sig Dispense Refill   albuterol (VENTOLIN HFA) 108 (90 Base) MCG/ACT inhaler Inhale 1-2 puffs into the lungs every 6 (six) hours as needed for wheezing or shortness of breath.     aMILoride (MIDAMOR) 5 MG tablet Take 5 mg by mouth daily.     atenolol (  TENORMIN) 100 MG tablet Take 100 mg by mouth daily.     butalbital-acetaminophen-caffeine (FIORICET, ESGIC) 50-325-40 MG tablet Take 1-2 tablets by mouth every 4 (four) hours as needed for headache.     cyclobenzaprine (FLEXERIL) 10 MG tablet Take 10 mg by mouth every 8 (eight) hours as needed for muscle spasms.     dexlansoprazole (DEXILANT) 60 MG capsule Take 60 mg by mouth daily.     levocetirizine (XYZAL) 5 MG tablet Take 5 mg by mouth every morning.     lisinopril-hydrochlorothiazide (PRINZIDE,ZESTORETIC) 20-25 MG tablet Take 1 tablet by mouth daily.     methocarbamol (ROBAXIN) 500 MG tablet Take 1 tablet (500 mg total) by mouth 2 (two) times daily. 20 tablet 0   omeprazole (PRILOSEC) 40 MG capsule take 1 capsule by mouth once daily for REFLUX     propranolol ER (INDERAL LA) 60 MG 24 hr capsule Take 60 mg by mouth daily.     rizatriptan (MAXALT-MLT) 10 MG disintegrating tablet rizatriptan 10 mg disintegrating tablet  take 1 tablet by mouth ONCE AS NEEDED FOR MIGRAINE FOR UP TO 1 DO...  (REFER TO PRESCRIPTION  NOTES).     SUMAtriptan (IMITREX) 100 MG tablet Take 100 mg by mouth every 2 (two) hours as needed for migraine. May repeat in 2 hours if headache persists or recurs.     topiramate (TOPAMAX) 25 MG tablet Take 50 mg by mouth daily.  1   tretinoin (RETIN-A) 0.025 % cream tretinoin 0.025 % topical cream     valACYclovir (VALTREX) 500 MG tablet Take 2,000 mg by mouth 2 (two) times daily as needed.     zolpidem (AMBIEN) 10 MG tablet TK 1 T PO IMMEDIATELY BEFORE BEDTIME FOR SLEEP  5   10894 Take 15 mL every 4 hours by oral route.     amoxicillin-clavulanate (AUGMENTIN) 875-125 MG tablet Take 1 tablet by mouth every 12 (twelve) hours. (Patient not taking: Reported on 03/08/2023) 14 tablet 0   BD PEN NEEDLE NANO 2ND GEN 32G X 4 MM MISC Inject into the skin.     chlorpheniramine-HYDROcodone (TUSSIONEX) 10-8 MG/5ML take 5 milliliters by mouth every 12 hours if needed for cough (Patient not taking: Reported on 03/08/2023)     desogestrel-ethinyl estradiol (RECLIPSEN) 0.15-30 MG-MCG tablet Take 1 tablet by mouth daily. (Patient not taking: Reported on 03/08/2023)     ibuprofen (IBU) 800 MG tablet take 1 tablet by mouth every 8 hours if needed for pain (Patient not taking: Reported on 03/08/2023)     influenza vac split quadrivalent PF (FLUARIX) 0.5 ML injection inject 0.5 milliliter intramuscularly     metFORMIN (GLUCOPHAGE-XR) 500 MG 24 hr tablet take 2 tablets by mouth every morning and 1 tablet every evening (Patient not taking: Reported on 03/08/2023)     methocarbamol (ROBAXIN) 500 MG tablet Take 500 mg by mouth 2 (two) times daily. (Patient not taking: Reported on 03/08/2023)     methylPREDNISolone (MEDROL DOSEPAK) 4 MG TBPK tablet Steroid taper. Take as directed by packaging. (Patient not taking: Reported on 03/08/2023) 21 tablet 0   MOUNJARO 5 MG/0.5ML Pen SMARTSIG:0.5 Milliliter(s) SUB-Q Once a Week (Patient not taking: Reported on 03/08/2023)     norethindrone (MICRONOR) 0.35 MG tablet Take 1 tablet by mouth daily.  (Patient not taking: Reported on 03/08/2023)     ondansetron (ZOFRAN ODT) 4 MG disintegrating tablet Take 1 tablet (4 mg total) by mouth every 4 (four) hours as needed for nausea or vomiting. (Patient not taking: Reported  on 03/08/2023) 20 tablet 0   oxyCODONE (OXY IR/ROXICODONE) 5 MG immediate release tablet Take by mouth. (Patient not taking: Reported on 03/08/2023)     PREVIDENT 5000 SENSITIVE 1.1-5 % PSTE BRUSH WITH PREVIDENT 2 TIMES A DAY (Patient not taking: Reported on 03/08/2023)  5   silver sulfADIAZINE (SSD) 1 % cream apply to affected area once daily (Patient not taking: Reported on 03/08/2023)     SUMAtriptan (IMITREX) 100 MG tablet Take by mouth. (Patient not taking: Reported on 03/08/2023)     valACYclovir (VALTREX) 1000 MG tablet Take by mouth. (Patient not taking: Reported on 03/08/2023)     verapamil (CALAN-SR) 180 MG CR tablet take 1 tablet by mouth once daily for hypertension and migraines (Patient not taking: Reported on 03/08/2023)     VICTOZA 18 MG/3ML SOPN Inject into the skin. (Patient not taking: Reported on 03/08/2023)     WEGOVY 2.4 MG/0.75ML SOAJ SMARTSIG:2.4 Milligram(s) SUB-Q Once a Week        Allergies: Allergies  Allergen Reactions   Ketorolac Tromethamine Anaphylaxis    Shots   Morphine Other (See Comments)    Caused stomach pain      Past Medical History: Past Medical History:  Diagnosis Date   GERD (gastroesophageal reflux disease)    Headache    Hypertension      Past Surgical History: Past Surgical History:  Procedure Laterality Date   CHOLECYSTECTOMY     COLONOSCOPY     COLONOSCOPY WITH PROPOFOL N/A 12/09/2018   Procedure: COLONOSCOPY WITH PROPOFOL;  Surgeon: Lin Landsman, MD;  Location: [facility] ENDOSCOPY;  Service: Gastroenterology;  Laterality: N/A;   CYST EXCISION     LAPAROSCOPIC GASTRIC SLEEVE RESECTION WITH HIATAL HERNIA REPAIR  01/02/2017   Procedure: LAPAROSCOPIC GASTRIC SLEEVE RESECTION WITH HIATAL HERNIA REPAIR;  Surgeon: Bonner Puna, MD;   Location: [facility] ORS;  Service: General;;     Family History: Family History  Problem Relation Age of Onset   CAD Father    Lung cancer Father      Social History: Social History   Socioeconomic History   Marital status: Married    Spouse name: Not on file   Number of children: Not on file   Years of education: Not on file   Highest education level: Not on file  Occupational History   Not on file  Tobacco Use   Smoking status: Former    Packs/day: 0.50    Years: 15.00    Additional pack years: 0.00    Total pack years: 7.50    Types: Cigarettes    Quit date: 12/27/2015    Years since quitting: 7.2   Smokeless tobacco: Never   Tobacco comments:    CURRENTLY VAPES  Vaping Use   Vaping Use: Never used  Substance and Sexual Activity   Alcohol use: No   Drug use: No   Sexual activity: Not on file  Other Topics Concern   Not on file  Social History Narrative   Not on file   Social Determinants of Health   Financial Resource Strain: Not on file  Food Insecurity: Not on file  Transportation Needs: Not on file  Physical Activity: Not on file  Stress: Not on file  Social Connections: Not on file  Intimate Partner Violence: Not on file     Review of Systems: Review of Systems  Constitutional:  Negative for chills, fever and malaise/fatigue.  HENT:  Negative for congestion, sore throat and tinnitus.   Eyes:  Negative for blurred vision and redness.  Respiratory:  Negative for cough, shortness of breath and wheezing.   Cardiovascular:  Negative for chest pain, palpitations, claudication and leg swelling.  Gastrointestinal:  Positive for abdominal pain and nausea. Negative for blood in stool, diarrhea and vomiting.  Genitourinary:  Negative for flank pain, frequency and hematuria.  Musculoskeletal:  Positive for back pain. Negative for falls and myalgias.  Skin:  Negative for rash.  Neurological:  Negative for dizziness, weakness and headaches.   Endo/Heme/Allergies:  Does not bruise/bleed easily.  Psychiatric/Behavioral:  Negative for depression. The patient is not nervous/anxious and does not have insomnia.     Vital Signs: Blood pressure 117/75, pulse 81, temperature 98.9 F (37.2 C), temperature source Oral, resp. rate 20, height 5\' 5"  (1.651 m), weight 70.3 kg, last menstrual period 04/04/2017, SpO2 100 %.  Weight trends: Filed Weights   03/07/23 2358  Weight: 70.3 kg    Physical Exam: General: NAD  Head: Normocephalic, atraumatic. Moist oral mucosal membranes  Eyes: Anicteric  Lungs:  Clear to auscultation, normal effort, room air  Heart: Regular rate and rhythm  Abdomen:  Soft, nontender  Extremities:  No peripheral edema.  Neurologic: Nonfocal, moving all four extremities  Skin: No lesions  Access: None     Lab results: Basic Metabolic Panel: Recent Labs  Lab 03/08/23 0009  NA 133*  K 4.1  CL 103  CO2 22  GLUCOSE 93  BUN 24*  CREATININE 1.53*  CALCIUM 8.7*    Liver Function Tests: Recent Labs  Lab 03/08/23 0009  AST 17  ALT 14  ALKPHOS 68  BILITOT 0.4  PROT 6.9  ALBUMIN 4.0   Recent Labs  Lab 03/08/23 0009  LIPASE 41   No results for input(s): "AMMONIA" in the last 168 hours.  CBC: Recent Labs  Lab 03/08/23 0009  WBC 7.3  NEUTROABS 4.7  HGB 12.7  HCT 37.0  MCV 95.6  PLT 231    Cardiac Enzymes: No results for input(s): "CKTOTAL", "CKMB", "CKMBINDEX", "TROPONINI" in the last 168 hours.  BNP: Invalid input(s): "POCBNP"  CBG: No results for input(s): "GLUCAP" in the last 168 hours.  Microbiology: Results for orders placed or performed during the hospital encounter of 01/02/19  Urine Culture     Status: None   Collection Time: 01/02/19 10:50 PM   Specimen: Urine, Clean Catch  Result Value Ref Range Status   Specimen Description   Final    URINE, CLEAN CATCH Performed at [facility], [address], [place], [place] [address]    Special Requests   Final     Normal Performed at [facility], [address], [place], [place] [address]    Culture   Final    NO GROWTH Performed at [facility], [place] [address], [place], [place] [address]    Report Status 01/04/2019 FINAL  Final    Coagulation Studies: No results for input(s): "LABPROT", "INR" in the last 72 hours.  Urinalysis: Recent Labs    03/08/23 0009  COLORURINE STRAW*  LABSPEC 1.004*  PHURINE 6.0  GLUCOSEU NEGATIVE  HGBUR NEGATIVE  BILIRUBINUR NEGATIVE  KETONESUR NEGATIVE  PROTEINUR 30*  NITRITE NEGATIVE  LEUKOCYTESUR NEGATIVE      Imaging: CT Renal Stone Study  Result Date: 03/08/2023 CLINICAL DATA:  Flank pain. Stone suspected. History of kidney failure. EXAM: CT ABDOMEN AND PELVIS WITHOUT CONTRAST TECHNIQUE: Multidetector CT imaging of the abdomen and pelvis was performed following the standard protocol without IV  contrast. RADIATION DOSE REDUCTION: This exam was performed according to the departmental dose-optimization program which includes automated exposure control, adjustment of the mA and/or kV according to patient size and/or use of iterative reconstruction technique. COMPARISON:  CT with IV contrast 01/03/2019 FINDINGS: Lower chest: No acute abnormality. Hepatobiliary: No focal liver abnormality is seen without contrast. Status post cholecystectomy. No biliary dilatation. Pancreas: Unremarkable without contrast. Spleen: Unremarkable without contrast. Adrenals/Urinary Tract: There is no adrenal mass. There is no focal abnormality of unenhanced renal cortex. There is diffuse bilateral perirenal stranding. This is similar to the prior study, and could simply be chronic change or could indicate pyelonephritis or acute kidney injury. There is no urinary stone or obstruction. The bladder is contracted and not well seen but does not show adjacent inflammatory change. Stomach/Bowel: Postsurgical change noted along the outer gastric wall suggesting a previous  sleeve gastrectomy. The stomach is contracted. Small and large bowel and appendix demonstrate no dilatation or wall thickening. There are left colonic diverticula without evidence of diverticulitis. Vascular/Lymphatic: Aortic atherosclerosis. No enlarged abdominal or pelvic lymph nodes. Reproductive: The uterus is absent. Left ovarian dermoid was previously 2 cm and -64 Hounsfield units, today is 4 cm and -33 Hounsfield units effectively having doubled in size in 4 years. There are no complicating features along the wall. No right adnexal abnormality. Other: There is no free air, free fluid, free hemorrhage or incarcerated hernia. Musculoskeletal: There is spondylosis of the thoracic and lumbar spine, mild facet spurring. No primary pathologic bone lesion is seen. IMPRESSION: 1. Diffuse bilateral perirenal stranding similar to the prior study. This could simply be chronic change or could indicate pyelonephritis or acute kidney injury. 2. No urinary stone or obstruction. 3. Aortic atherosclerosis. 4. Diverticulosis without evidence of diverticulitis. 5. 4 cm left ovarian dermoid, previously 2 cm. Consider gyn consult. Aortic Atherosclerosis (ICD10-I70.0). Electronically Signed   By: Telford Nab M.D.   On: 03/08/2023 04:43     Assessment & Plan: Patricia Gonzalez is a 42 y.o.  female with past medical history of hypertension and GERD, who was admitted to [facility] on 03/08/2023 for AKI (acute kidney injury) [N17.9] Acute pyelonephritis [N10]   Acute kidney injury appears idiopathic at this time. CT renal stone negative for obstruction, concerning for pyelonephritis however clinical presentation not corresponding. Continue IVF and monitor.   2. Hypertension, essential. Home regimen includes lisinopril-HCTZ, propanalol, and verapamil. All currently held  3. Hyponatremia, secondary to kidney injury. Will improve with IVF and oral intake.   LOS: 1 Otto Caraway 4/4/20241:44 PM

## 2023-03-09 DIAGNOSIS — E871 Hypo-osmolality and hyponatremia: Secondary | ICD-10-CM | POA: Diagnosis present

## 2023-03-09 DIAGNOSIS — E876 Hypokalemia: Secondary | ICD-10-CM | POA: Diagnosis not present

## 2023-03-09 DIAGNOSIS — N179 Acute kidney failure, unspecified: Secondary | ICD-10-CM | POA: Diagnosis not present

## 2023-03-09 LAB — CBC
HCT: 33.8 % — ABNORMAL LOW (ref 36.0–46.0)
Hemoglobin: 11.5 g/dL — ABNORMAL LOW (ref 12.0–15.0)
MCH: 33.4 pg (ref 26.0–34.0)
MCHC: 34 g/dL (ref 30.0–36.0)
MCV: 98.3 fL (ref 80.0–100.0)
Platelets: 211 10*3/uL (ref 150–400)
RBC: 3.44 MIL/uL — ABNORMAL LOW (ref 3.87–5.11)
RDW: 12.5 % (ref 11.5–15.5)
WBC: 3.2 10*3/uL — ABNORMAL LOW (ref 4.0–10.5)
nRBC: 0 % (ref 0.0–0.2)

## 2023-03-09 LAB — HEPATITIS B CORE ANTIBODY, TOTAL: Hep B Core Total Ab: NONREACTIVE

## 2023-03-09 LAB — URINE CULTURE: Culture: NO GROWTH

## 2023-03-09 LAB — PHOSPHORUS: Phosphorus: 4 mg/dL (ref 2.5–4.6)

## 2023-03-09 LAB — BASIC METABOLIC PANEL
Anion gap: 6 (ref 5–15)
BUN: 12 mg/dL (ref 6–20)
CO2: 23 mmol/L (ref 22–32)
Calcium: 8.6 mg/dL — ABNORMAL LOW (ref 8.9–10.3)
Chloride: 114 mmol/L — ABNORMAL HIGH (ref 98–111)
Creatinine, Ser: 0.89 mg/dL (ref 0.44–1.00)
GFR, Estimated: >60 mL/min (ref >60)
Glucose, Bld: 104 mg/dL — ABNORMAL HIGH (ref 70–99)
Potassium: 3.2 mmol/L — ABNORMAL LOW (ref 3.5–5.1)
Sodium: 143 mmol/L (ref 135–145)

## 2023-03-09 LAB — HIV ANTIBODY (ROUTINE TESTING W REFLEX): HIV Screen 4th Generation wRfx: NONREACTIVE

## 2023-03-09 LAB — HEPATITIS B SURFACE ANTIGEN: Hepatitis B Surface Ag: NONREACTIVE

## 2023-03-09 LAB — HEPATITIS B CORE ANTIBODY, IGM: Hep B C IgM: NONREACTIVE

## 2023-03-09 MED ORDER — OXYCODONE-ACETAMINOPHEN 5-325 MG PO TABS
1.0000 | ORAL_TABLET | ORAL | Status: DC | PRN
  Administered 2023-03-09 – 2023-03-13 (×14): 2 via ORAL
  Filled 2023-03-09 (×14): qty 2

## 2023-03-09 MED ORDER — ALUM & MAG HYDROXIDE-SIMETH 200-200-20 MG/5ML PO SUSP
30.0000 mL | Freq: Four times a day (QID) | ORAL | Status: DC | PRN
  Administered 2023-03-09: 30 mL via ORAL
  Filled 2023-03-09 (×2): qty 30

## 2023-03-09 MED ORDER — PANTOPRAZOLE SODIUM 40 MG IV SOLR
40.0000 mg | Freq: Two times a day (BID) | INTRAVENOUS | Status: DC
  Administered 2023-03-09 – 2023-03-13 (×9): 40 mg via INTRAVENOUS
  Filled 2023-03-09 (×9): qty 10

## 2023-03-09 MED ORDER — POTASSIUM CHLORIDE CRYS ER 20 MEQ PO TBCR
40.0000 meq | EXTENDED_RELEASE_TABLET | Freq: Once | ORAL | Status: AC
  Administered 2023-03-09: 40 meq via ORAL
  Filled 2023-03-09: qty 2

## 2023-03-09 MED ORDER — ATENOLOL 100 MG PO TABS
100.0000 mg | ORAL_TABLET | Freq: Every day | ORAL | Status: DC
  Administered 2023-03-09 – 2023-03-11 (×3): 100 mg via ORAL
  Filled 2023-03-09 (×3): qty 1

## 2023-03-09 MED ORDER — SENNOSIDES-DOCUSATE SODIUM 8.6-50 MG PO TABS
1.0000 | ORAL_TABLET | Freq: Two times a day (BID) | ORAL | Status: DC
  Administered 2023-03-09 – 2023-03-13 (×8): 1 via ORAL
  Filled 2023-03-09 (×9): qty 1

## 2023-03-09 NOTE — Assessment & Plan Note (Addendum)
Gastritis, Nausea Bilateral flank pain Pyelonephritis - Ruled Out - UA benign, urine culture no growth. 4/6 - persistent abdominal pain, unable to eat.  No relief with GI cocktail. 4/7 - GI consulted 4/8 - EGD showed gastritis --Follow up GI's recommendations --See AKI --Beginning to tolerate solid foods but still with abdominal pain, tolerating liquids wlel --Resume home PPI regimen --Follow H pylori biopsies --Follow up with GI as instructed --Percocet PRN, Phenergan or Zofran PRN for nausea, Carafate for gastric protection

## 2023-03-09 NOTE — TOC CM/SW Note (Signed)
  Transition of Care [facility]) Screening Note   Patient Details  Name: Patricia Gonzalez Date of Birth: 23-Apr-1981   Transition of Care [facility]) CM/SW Contact:    Allena Katz, LCSW Phone Number: 03/09/2023, 11:38 AM    Transition of Care Department [facility]) has reviewed patient and no TOC needs have been identified at this time. We will continue to monitor patient advancement through interdisciplinary progression rounds. If new patient transition needs arise, please place a TOC consult.

## 2023-03-09 NOTE — Hospital Course (Signed)
Patricia Gonzalez is a 42 y.o. female with medical history significant of hypertension, GERD, s/p Roux-en-Y, migraines who presented to the ED around midnight 03/08/2023 for evaluation of bilateral flank and generalized abdominal pain with nausea. No vomiting or fever/chills.  Also reports poor PO intake due to the nausea.  She reports this illness resembles what she experienced in early 2020 when she presented with similar symptoms and was found to have AKI and signs of inflammation of bilateral kidneys on imaging.  She was seen by nephrology and followed up.  Has been doing well in the interim.    ED course -- vitals were normal. Labs were notable for sodium 133, BUN 24 and Cr 1.53 (baseline in Feb 2023 was 0.72). No leukocytosis on CBC. CT renal stone protocol showed bilateral perinephric stranding concerning for possible pyelonephritis vs AKI. Urinalysis was without signs of infection but sent for culture.   Admitted for further evaluation, IV fluids, antiemetics and pain management.    Consults - Nephrology

## 2023-03-09 NOTE — Progress Notes (Signed)
Progress Note   Patient: Patricia Amaslizabeth H Nida BJY:[id number] DOB: 12/30/1980 DOA: 03/08/2023     1 DOS: the patient was seen and examined on 03/09/2023   Brief hospital course: Patricia Gonzalez is a 42 y.o. female with medical history significant of hypertension, GERD, s/p Roux-en-Y, migraines who presented to the ED around midnight 03/08/2023 for evaluation of bilateral flank and generalized abdominal pain with nausea. No vomiting or fever/chills.  Also reports poor PO intake due to the nausea.  She reports this illness resembles what she experienced in early 2020 when she presented with similar symptoms and was found to have AKI and signs of inflammation of bilateral kidneys on imaging.  She was seen by nephrology and followed up.  Has been doing well in the interim.    ED course -- vitals were normal. Labs were notable for sodium 133, BUN 24 and Cr 1.53 (baseline in Feb 2023 was 0.72). No leukocytosis on CBC. CT renal stone protocol showed bilateral perinephric stranding concerning for possible pyelonephritis vs AKI. Urinalysis was without signs of infection but sent for culture.   Admitted for further evaluation, IV fluids, antiemetics and pain management.    Consults - Nephrology   Assessment and Plan: * AKI (acute kidney injury) Cr 1.53 on admission. Baseline Cr 0.72 in Feb 2023. Hx of admission in 2020 for AKI and similar presenting complaints. Cr improved to 0.89 with IV fluids --Nephrology consulted --Continue IV fluids until tolerating PO intake  --Monitor BMP --Avoid nephrotoxins, renally dose meds  Hyponatremia Na on admission 133, mildly low. Resolved with IV hydration, consistent with hypovolemic hyponatremia likely due to poor PO intake with nausea. --Monitor BMP --Stop fluids when tolerating PO intake  Acute pyelonephritis Ruled Out.  UA benign and urine culture no growth. See AKI  Abdominal pain, generalized Bilateral flank pain Nausea Pyelonephritis - Ruled Out - UA  benign, urine culture no growth. --See AKI --Supportive care with fluids, antiemetics, pain management --Added Maalox PRN for indigestion --IV PPI BID for now as pt notes poor efficacy with PO Protonix and her PO PPI is not on formulary here  Insomnia Continue home Ambien  GERD (gastroesophageal reflux disease) Using IV PPI for now, pt states PO Protonix not effective for her.  Her home PPI not on formulary.  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  Flank pain is no worse on left than right per patient, so unlikely this contributes to her acute illness.   --On call Gynecologist recommended outpatient follow up  Intractable chronic migraine without aura and without status migrainosus Continue daily Topamax, PRN Maxalt or PRN Fioricet  Essential hypertension Hold home antihypertensives for now, BP's are normal and soft at times.  Appears home regimen is amiloride 5 mg daily, atenolol 100 mg daily, lisinopril/HCTZ 20-25 mg daily, propranolol ER 60 mg daily. --Monitor BP's  --Resume atenolol, continue holding others for now & resume as indicated        Subjective: Pt seen ambulating in her room this AM.  She reports ongoing abdominal pain, somewhat better but having severe heartburn/indigestion.  Tolerating some liquids PO, but so far not food.  Ongoing nausea, no vomiting.    Physical Exam: Vitals:   03/08/23 1543 03/08/23 2023 03/09/23 0456 03/09/23 0811  BP: 134/76 136/83 135/82 (!) 150/85  Pulse: 70 95 71 93  Resp:  18 16 18   Temp: 97.7 F (36.5 C)  97.8 F (36.6 C) 97.8 F (36.6 C) (!) 97 F (36.1 C)  TempSrc:  Oral    SpO2: 100% 100% 100% 100%  Weight:      Height:       General exam: awake, alert, no mild distress related to abdominal pain HEENT: moist mucus membranes, hearing grossly normal  Respiratory system: CTAB, no wheezes, rales or rhonchi, normal respiratory  effort. Cardiovascular system: normal S1/S2, RRR, no JVD, murmurs, rubs, gallops, no pedal edema.   Gastrointestinal system: soft, epigastric tenderness with no rebound tenderness, non-distended Central nervous system: A&O x4. no gross focal neurologic deficits, normal speech Extremities: moves all, no edema, normal tone Skin: dry, intact, normal temperature Psychiatry: normal mood, congruent affect, judgement and insight appear normal   Data Reviewed:  Notable labs --- Cr improved to 0.82, K 3.2,  glucose 104, Ca 8.6, WBC 3.2, Hbg 11.5  Hepatitis B serologies all non-reactive  Micro -- Urine culture no growth  Family Communication: None. Pt is able to update.  Disposition: Status is: Observation The patient remains OBS appropriate and will d/c before 2 midnights. Remains admitted due to ongoing abdominal pain and nausea requiring IV therapies as outlined.   Planned Discharge Destination: Home    Time spent: 42 minutes  Author: Pennie Banter, DO 03/09/2023 1:01 PM  For on call review [email or web address].

## 2023-03-09 NOTE — Assessment & Plan Note (Signed)
Resolved.  K 4.7 K 3.2 (4/5) - replaced.  Resolved. Monitor BMP, replace K PRN.

## 2023-03-09 NOTE — Assessment & Plan Note (Signed)
Na on admission 133, mildly low. Resolved with IV hydration, consistent with hypovolemic hyponatremia likely due to poor PO intake with nausea. --Monitor BMP --Stop fluids when tolerating PO intake

## 2023-03-10 DIAGNOSIS — Z7984 Long term (current) use of oral hypoglycemic drugs: Secondary | ICD-10-CM | POA: Diagnosis not present

## 2023-03-10 DIAGNOSIS — Z79899 Other long term (current) drug therapy: Secondary | ICD-10-CM | POA: Diagnosis not present

## 2023-03-10 DIAGNOSIS — Y990 Civilian activity done for income or pay: Secondary | ICD-10-CM | POA: Diagnosis not present

## 2023-03-10 DIAGNOSIS — R109 Unspecified abdominal pain: Secondary | ICD-10-CM | POA: Diagnosis present

## 2023-03-10 DIAGNOSIS — K219 Gastro-esophageal reflux disease without esophagitis: Secondary | ICD-10-CM | POA: Diagnosis present

## 2023-03-10 DIAGNOSIS — N83202 Unspecified ovarian cyst, left side: Secondary | ICD-10-CM | POA: Diagnosis present

## 2023-03-10 DIAGNOSIS — Z87891 Personal history of nicotine dependence: Secondary | ICD-10-CM | POA: Diagnosis not present

## 2023-03-10 DIAGNOSIS — I10 Essential (primary) hypertension: Secondary | ICD-10-CM | POA: Diagnosis present

## 2023-03-10 DIAGNOSIS — Z1152 Encounter for screening for COVID-19: Secondary | ICD-10-CM | POA: Diagnosis not present

## 2023-03-10 DIAGNOSIS — G43709 Chronic migraine without aura, not intractable, without status migrainosus: Secondary | ICD-10-CM | POA: Diagnosis present

## 2023-03-10 DIAGNOSIS — E876 Hypokalemia: Secondary | ICD-10-CM | POA: Diagnosis not present

## 2023-03-10 DIAGNOSIS — E861 Hypovolemia: Secondary | ICD-10-CM | POA: Diagnosis present

## 2023-03-10 DIAGNOSIS — Z90721 Acquired absence of ovaries, unilateral: Secondary | ICD-10-CM | POA: Diagnosis not present

## 2023-03-10 DIAGNOSIS — N179 Acute kidney failure, unspecified: Secondary | ICD-10-CM | POA: Diagnosis present

## 2023-03-10 DIAGNOSIS — Z9049 Acquired absence of other specified parts of digestive tract: Secondary | ICD-10-CM | POA: Diagnosis not present

## 2023-03-10 DIAGNOSIS — G4700 Insomnia, unspecified: Secondary | ICD-10-CM | POA: Diagnosis present

## 2023-03-10 DIAGNOSIS — Z8249 Family history of ischemic heart disease and other diseases of the circulatory system: Secondary | ICD-10-CM | POA: Diagnosis not present

## 2023-03-10 DIAGNOSIS — Z90710 Acquired absence of both cervix and uterus: Secondary | ICD-10-CM | POA: Diagnosis not present

## 2023-03-10 DIAGNOSIS — E871 Hypo-osmolality and hyponatremia: Secondary | ICD-10-CM | POA: Diagnosis present

## 2023-03-10 DIAGNOSIS — K2970 Gastritis, unspecified, without bleeding: Secondary | ICD-10-CM | POA: Diagnosis present

## 2023-03-10 DIAGNOSIS — E119 Type 2 diabetes mellitus without complications: Secondary | ICD-10-CM | POA: Diagnosis present

## 2023-03-10 DIAGNOSIS — R809 Proteinuria, unspecified: Secondary | ICD-10-CM | POA: Diagnosis present

## 2023-03-10 DIAGNOSIS — Z9884 Bariatric surgery status: Secondary | ICD-10-CM | POA: Diagnosis not present

## 2023-03-10 DIAGNOSIS — K449 Diaphragmatic hernia without obstruction or gangrene: Secondary | ICD-10-CM | POA: Diagnosis present

## 2023-03-10 LAB — BASIC METABOLIC PANEL
Anion gap: 6 (ref 5–15)
BUN: 11 mg/dL (ref 6–20)
CO2: 24 mmol/L (ref 22–32)
Calcium: 8.4 mg/dL — ABNORMAL LOW (ref 8.9–10.3)
Chloride: 112 mmol/L — ABNORMAL HIGH (ref 98–111)
Creatinine, Ser: 0.8 mg/dL (ref 0.44–1.00)
GFR, Estimated: >60 mL/min (ref >60)
Glucose, Bld: 89 mg/dL (ref 70–99)
Potassium: 4.4 mmol/L (ref 3.5–5.1)
Sodium: 142 mmol/L (ref 135–145)

## 2023-03-10 LAB — GLOMERULAR BASEMENT MEMBRANE ANTIBODIES: GBM Ab: <0.2 units (ref 0.0–0.9)

## 2023-03-10 LAB — ANA W/REFLEX: Anti Nuclear Antibody (ANA): NEGATIVE

## 2023-03-10 LAB — MAGNESIUM: Magnesium: 2 mg/dL (ref 1.7–2.4)

## 2023-03-10 MED ORDER — HYOSCYAMINE SULFATE 0.125 MG SL SUBL
0.2500 mg | SUBLINGUAL_TABLET | Freq: Once | SUBLINGUAL | Status: AC
  Administered 2023-03-10: 0.25 mg via SUBLINGUAL
  Filled 2023-03-10: qty 2

## 2023-03-10 MED ORDER — LIDOCAINE VISCOUS HCL 2 % MT SOLN
15.0000 mL | Freq: Once | OROMUCOSAL | Status: AC
  Administered 2023-03-10: 15 mL via OROMUCOSAL
  Filled 2023-03-10: qty 15

## 2023-03-10 MED ORDER — ALUM & MAG HYDROXIDE-SIMETH 200-200-20 MG/5ML PO SUSP
30.0000 mL | Freq: Once | ORAL | Status: AC
  Administered 2023-03-10: 30 mL via ORAL
  Filled 2023-03-10: qty 30

## 2023-03-10 NOTE — Progress Notes (Signed)
Progress Note   Patient: Patricia Gonzalez UVO:[id number] DOB: 08-24-1981 DOA: 03/08/2023     1 DOS: the patient was seen and examined on 03/10/2023   Brief hospital course: Patricia Gonzalez is a 42 y.o. female with medical history significant of hypertension, GERD, s/p Roux-en-Y, migraines who presented to the ED around midnight 03/08/2023 for evaluation of bilateral flank and generalized abdominal pain with nausea. No vomiting or fever/chills.  Also reports poor PO intake due to the nausea.  She reports this illness resembles what she experienced in early 2020 when she presented with similar symptoms and was found to have AKI and signs of inflammation of bilateral kidneys on imaging.  She was seen by nephrology and followed up.  Has been doing well in the interim.    ED course -- vitals were normal. Labs were notable for sodium 133, BUN 24 and Cr 1.53 (baseline in Feb 2023 was 0.72). No leukocytosis on CBC. CT renal stone protocol showed bilateral perinephric stranding concerning for possible pyelonephritis vs AKI. Urinalysis was without signs of infection but sent for culture.   Admitted for further evaluation, IV fluids, antiemetics and pain management.    Consults - Nephrology   Assessment and Plan: * AKI (acute kidney injury) Cr 1.53 on admission. Baseline Cr 0.72 in Feb 2023. Hx of admission in 2020 for AKI and similar presenting complaints. Cr improved to 0.89 with IV fluids --Nephrology consulted --Monitor off IV fluids --Monitor BMP --Avoid nephrotoxins, renally dose meds  Hyponatremia Na on admission 133, mildly low. Resolved with IV hydration, consistent with hypovolemic hyponatremia likely due to poor PO intake with nausea. --Monitor BMP --Off IV fluids, tolerating PO hydration  Acute pyelonephritis Ruled Out.  UA benign and urine culture no growth. See AKI  Abdominal pain, generalized Bilateral flank pain Nausea Pyelonephritis - Ruled Out - UA benign, urine culture no  growth. 4/6 - persistent abdominal pain, unable to eat.  No relief with GI cocktail. --Will consult GI, message sent to Dr. Norma Fredrickson --See AKI --Stop fluids, she is able to drink fluids ok --Supportive care - antiemetics, pain mgmt --Added Maalox PRN for indigestion --IV PPI BID for now as pt notes poor efficacy with PO Protonix and her PO PPI is not on formulary here  Hypokalemia K 3.2 (4/5) - replaced.  Resolved. Monitor BMP, replace K PRN.  Insomnia Continue home Ambien  GERD (gastroesophageal reflux disease) Using IV PPI for now, pt states PO Protonix not effective for her.  Her home PPI not on formulary.  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  Flank pain is no worse on left than right per patient, so unlikely this contributes to her acute illness.   --On call Gynecologist recommended outpatient follow up  Intractable chronic migraine without aura and without status migrainosus Continue daily Topamax, PRN Maxalt or PRN Fioricet  Essential hypertension Hold home antihypertensives for now, BP's are normal and soft at times.  Appears home regimen is amiloride 5 mg daily, atenolol 100 mg daily, lisinopril/HCTZ 20-25 mg daily, propranolol ER 60 mg daily. --Monitor BP's  --Resume atenolol, continue holding others for now & resume as indicated        Subjective: Pt resting in bed when seen today.  She reports ongoing abdominal pain and has not been able to eat.  She is tolerating water/fluids.  No fever/chills.  She states most pain is RUQ to  epigastric area, when she pushes on RUQ, pain travels across her upper abdomen. Confirms history of cholecystectomy.   Physical Exam: Vitals:   03/09/23 1550 03/09/23 1957 03/10/23 0525 03/10/23 0740  BP: 117/84 132/88 (!) 146/90 139/88  Pulse: 68 72 66 80  Resp: 18 16 16 16   Temp: 97.9 F (36.6 C) 97.8 F (36.6 C) 98.8 F (37.1 C) 98.8 F (37.1 C)   TempSrc:      SpO2: 100% 100% 100% 98%  Weight:      Height:       General exam: awake, alert, appears uncomfortable in pain HEENT: moist mucus membranes, hearing grossly normal  Respiratory system: on room air, normal respiratory effort. Cardiovascular system: normal S1/S2, RRR, trace edema.   Gastrointestinal system: soft, epigastric tenderness with no rebound tenderness, non-distended Central nervous system: A&O x4. no gross focal neurologic deficits, normal speech Extremities: moves all, trace edema, normal tone Psychiatry: normal mood, congruent affect, judgement and insight appear normal   Data Reviewed:  Notable labs --- Cl 112, Ca 8.4 otherwise normal BMP.      Micro -- Urine culture no growth  Family Communication: None. Pt is able to update.  Disposition: Status is: Observation The patient remains OBS appropriate and will d/c before 2 midnights. Persistent severe abdominal pain and nausea requiring IV therapies and further evaluation. GI consult pending.  Not tolerating adequate PO yet.   Planned Discharge Destination: Home    Time spent: 38 minutes  Author: Pennie Banter, DO 03/10/2023 2:11 PM  For on call review [email or web address].

## 2023-03-11 DIAGNOSIS — R509 Fever, unspecified: Secondary | ICD-10-CM | POA: Diagnosis not present

## 2023-03-11 LAB — BASIC METABOLIC PANEL
Anion gap: 7 (ref 5–15)
BUN: 14 mg/dL (ref 6–20)
CO2: 24 mmol/L (ref 22–32)
Calcium: 8.5 mg/dL — ABNORMAL LOW (ref 8.9–10.3)
Chloride: 110 mmol/L (ref 98–111)
Creatinine, Ser: 0.92 mg/dL (ref 0.44–1.00)
GFR, Estimated: >60 mL/min (ref >60)
Glucose, Bld: 83 mg/dL (ref 70–99)
Potassium: 4 mmol/L (ref 3.5–5.1)
Sodium: 141 mmol/L (ref 135–145)

## 2023-03-11 LAB — CBC
HCT: 28.4 % — ABNORMAL LOW (ref 36.0–46.0)
Hemoglobin: 9.8 g/dL — ABNORMAL LOW (ref 12.0–15.0)
MCH: 33.1 pg (ref 26.0–34.0)
MCHC: 34.5 g/dL (ref 30.0–36.0)
MCV: 95.9 fL (ref 80.0–100.0)
Platelets: 165 10*3/uL (ref 150–400)
RBC: 2.96 MIL/uL — ABNORMAL LOW (ref 3.87–5.11)
RDW: 12.4 % (ref 11.5–15.5)
WBC: 3.8 10*3/uL — ABNORMAL LOW (ref 4.0–10.5)
nRBC: 0 % (ref 0.0–0.2)

## 2023-03-11 LAB — C3 COMPLEMENT: C3 Complement: 96 mg/dL (ref 82–167)

## 2023-03-11 LAB — C4 COMPLEMENT: Complement C4, Body Fluid: 19 mg/dL (ref 12–38)

## 2023-03-11 LAB — PROCALCITONIN: Procalcitonin: <0.1 ng/mL

## 2023-03-11 LAB — LACTIC ACID, PLASMA: Lactic Acid, Venous: 0.9 mmol/L (ref 0.5–1.9)

## 2023-03-11 MED ORDER — LABETALOL HCL 5 MG/ML IV SOLN: 5.0000 mg | INTRAVENOUS | Status: DC | PRN

## 2023-03-11 MED ORDER — DM-GUAIFENESIN ER 30-600 MG PO TB12
1.0000 | ORAL_TABLET | Freq: Two times a day (BID) | ORAL | Status: DC
  Administered 2023-03-11 – 2023-03-13 (×5): 1 via ORAL
  Filled 2023-03-11 (×5): qty 1

## 2023-03-11 MED ORDER — AMILORIDE HCL 5 MG PO TABS
5.0000 mg | ORAL_TABLET | Freq: Every day | ORAL | Status: DC
  Filled 2023-03-11: qty 1

## 2023-03-11 MED ORDER — DOCUSATE SODIUM 100 MG PO CAPS
100.0000 mg | ORAL_CAPSULE | Freq: Two times a day (BID) | ORAL | Status: DC
  Administered 2023-03-11 – 2023-03-13 (×5): 100 mg via ORAL
  Filled 2023-03-11 (×5): qty 1

## 2023-03-11 MED ORDER — AMILORIDE HCL 5 MG PO TABS
5.0000 mg | ORAL_TABLET | Freq: Every day | ORAL | Status: DC
  Administered 2023-03-11 – 2023-03-13 (×3): 5 mg via ORAL
  Filled 2023-03-11 (×4): qty 1

## 2023-03-11 MED ORDER — PROPRANOLOL HCL ER 60 MG PO CP24
60.0000 mg | ORAL_CAPSULE | Freq: Every day | ORAL | Status: DC
  Administered 2023-03-12 – 2023-03-13 (×2): 60 mg via ORAL
  Filled 2023-03-11 (×2): qty 1

## 2023-03-11 MED ORDER — NAPHAZOLINE-PHENIRAMINE 0.025-0.3 % OP SOLN
1.0000 [drp] | Freq: Four times a day (QID) | OPHTHALMIC | Status: DC | PRN
  Filled 2023-03-11: qty 5

## 2023-03-11 NOTE — Consult Note (Signed)
GI Inpatient Consult Note  Reason for Consult: Persistent abdominal pain, intractable nausea    Attending Requesting Consult: Dr. Esaw GrandchildKelly Gonzalez   History of Present Illness: Patricia Gonzalez is a 42 y.o. female seen for evaluation of persistent, unexplained abdominal pain with intractable nausea at the request of hospitalist - Dr. Esaw GrandchildKelly Gonzalez. Patient has a PMH of HTN, GERD, migraines, s/p CCY, and hx of Roux-en-Y gastric bypass. [facility] for chief complaint of diffuse abdominal pain and bilateral flank pain with associated nausea and decreased PO intake. Upon presentation to the ED, all vital signs were within normal limits. Labs significant for Na 133, serum creatinine 1.53, BUN 24. She had CT renal stone protocol showed bilateral perinephric stranding concerning for possible pyelonephritis versus AKI. Nephrology was consulted and pyelonephritis was ruled out. UA benign and urine culture negative. She has been treated with IV fluid hydration, antiemetics, and pain medicine. IV PPI BID has been given due to hx of GERD. GI consulted for further evaluation and management.   Patient seen and examined this morning resting comfortably in hospital bed. No acute events overnight. She reports she has been unable to eat or drink most things due to severe nausea and abdominal pain. She is no longer having the back pain. She reports her abdomen hurts all over and is present in all four quadrants. She reports that moving around in the bed and coughing seem to exacerbate the pain. There is no correlation to defecation. She hasn't had a bowel movement in the last 2 days. Normally at home she has 1 BM a day. She denies any hematochezia or melanotic stool. She denies any associated vomiting. She feels like food is sitting at top of her stomach. No esophageal dysphagia or odynophagia symptoms. GERD is uncontrolled at baseline. Her insurance stopped covering Dexilant so she has been  started on Prevacid. PO Protonix has not been helpful in the past for her. She is s/p cholecystectomy and lap sleeve gastrectomy and hiatal hernia repair 12/2016. She is on Wegovy injections weekly and has been on them for the past year. She has lost 20-lbs on the GLP-1 agonist therapy. She takes occasional BC powder during the week for headaches. She denies any known family history of chronic GI illnesses. She was only able to have a piece of toast yesterday. Antiemetics are working well currently.    CSY 12/09/2018 ([place]) - preparation of the colon was fair, normal examined mucosa in entire examined colon, single 3 mm solitary ulcer in rectum seen upon retroflexion representing previous banding site without bleeding   Past Medical History:  Past Medical History:  Diagnosis Date   GERD (gastroesophageal reflux disease)    Headache    Hypertension     Problem List: Patient Active Problem List   Diagnosis Date Noted   Hyponatremia 03/09/2023   Hypokalemia 03/09/2023   AKI (acute kidney injury) 03/08/2023   Ovarian cyst, left 03/08/2023   GERD (gastroesophageal reflux disease) 03/08/2023   Insomnia 03/08/2023   Abdominal pain, generalized 01/03/2019   Acute pyelonephritis 01/03/2019   Acute renal failure (ARF) 01/03/2019   Rectal bleeding    Intractable chronic migraine without aura and without status migrainosus 01/18/2017   Morbid obesity 01/02/2017   Obstructive sleep apnea syndrome 07/11/2016   Diabetes mellitus 05/09/2016   Essential hypertension 05/09/2016   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    Past Surgical History: Past Surgical History:  Procedure Laterality Date  CHOLECYSTECTOMY     COLONOSCOPY     COLONOSCOPY WITH PROPOFOL N/A 12/09/2018   Procedure: COLONOSCOPY WITH PROPOFOL;  Surgeon: Toney Reil, MD;  Location: [facility] ENDOSCOPY;  Service: Gastroenterology;  Laterality: N/A;   CYST EXCISION     LAPAROSCOPIC GASTRIC SLEEVE RESECTION WITH  HIATAL HERNIA REPAIR  01/02/2017   Procedure: LAPAROSCOPIC GASTRIC SLEEVE RESECTION WITH HIATAL HERNIA REPAIR;  Surgeon: Geoffry Paradise, MD;  Location: [facility] ORS;  Service: General;;    Allergies: Allergies  Allergen Reactions   Ketorolac Tromethamine Anaphylaxis    Shots   Morphine Other (See Comments)    Caused stomach pain    Home Medications: Medications Prior to Admission  Medication Sig Dispense Refill Last Dose   albuterol (VENTOLIN HFA) 108 (90 Base) MCG/ACT inhaler Inhale 1-2 puffs into the lungs every 6 (six) hours as needed for wheezing or shortness of breath.   unknown   aMILoride (MIDAMOR) 5 MG tablet Take 5 mg by mouth daily.   03/07/2023   atenolol (TENORMIN) 100 MG tablet Take 100 mg by mouth daily.   03/07/2023   butalbital-acetaminophen-caffeine (FIORICET, ESGIC) 50-325-40 MG tablet Take 1-2 tablets by mouth every 4 (four) hours as needed for headache.   unknown   cyclobenzaprine (FLEXERIL) 10 MG tablet Take 10 mg by mouth every 8 (eight) hours as needed for muscle spasms.   Past Week   dexlansoprazole (DEXILANT) 60 MG capsule Take 60 mg by mouth daily.   03/07/2023   levocetirizine (XYZAL) 5 MG tablet Take 5 mg by mouth every morning.   03/07/2023   lisinopril-hydrochlorothiazide (PRINZIDE,ZESTORETIC) 20-25 MG tablet Take 1 tablet by mouth daily.   03/07/2023   methocarbamol (ROBAXIN) 500 MG tablet Take 1 tablet (500 mg total) by mouth 2 (two) times daily. 20 tablet 0 03/07/2023   omeprazole (PRILOSEC) 40 MG capsule take 1 capsule by mouth once daily for REFLUX   03/07/2023   propranolol ER (INDERAL LA) 60 MG 24 hr capsule Take 60 mg by mouth daily.   03/07/2023   rizatriptan (MAXALT-MLT) 10 MG disintegrating tablet rizatriptan 10 mg disintegrating tablet  take 1 tablet by mouth ONCE AS NEEDED FOR MIGRAINE FOR UP TO 1 DO...  (REFER TO PRESCRIPTION NOTES).   unknown   SUMAtriptan (IMITREX) 100 MG tablet Take 100 mg by mouth every 2 (two) hours as needed for migraine. May repeat in 2 hours if  headache persists or recurs.   unknown   topiramate (TOPAMAX) 25 MG tablet Take 50 mg by mouth daily.  1 03/07/2023   tretinoin (RETIN-A) 0.025 % cream tretinoin 0.025 % topical cream   Past Week   valACYclovir (VALTREX) 500 MG tablet Take 2,000 mg by mouth 2 (two) times daily as needed.   Past Week   zolpidem (AMBIEN) 10 MG tablet TK 1 T PO IMMEDIATELY BEFORE BEDTIME FOR SLEEP  5 03/07/2023   10894 Take 15 mL every 4 hours by oral route.      amoxicillin-clavulanate (AUGMENTIN) 875-125 MG tablet Take 1 tablet by mouth every 12 (twelve) hours. (Patient not taking: Reported on 03/08/2023) 14 tablet 0 Not Taking   BD PEN NEEDLE NANO 2ND GEN 32G X 4 MM MISC Inject into the skin.      ibuprofen (IBU) 800 MG tablet take 1 tablet by mouth every 8 hours if needed for pain (Patient not taking: Reported on 03/08/2023)   Not Taking   influenza vac split quadrivalent PF (FLUARIX) 0.5 ML injection inject 0.5 milliliter intramuscularly  methocarbamol (ROBAXIN) 500 MG tablet Take 500 mg by mouth 2 (two) times daily. (Patient not taking: Reported on 03/08/2023)   Not Taking   oxyCODONE (OXY IR/ROXICODONE) 5 MG immediate release tablet Take by mouth. (Patient not taking: Reported on 03/08/2023)   Not Taking   PREVIDENT 5000 SENSITIVE 1.1-5 % PSTE BRUSH WITH PREVIDENT 2 TIMES A DAY (Patient not taking: Reported on 03/08/2023)  5 Not Taking   silver sulfADIAZINE (SSD) 1 % cream apply to affected area once daily (Patient not taking: Reported on 03/08/2023)   Not Taking   SUMAtriptan (IMITREX) 100 MG tablet Take by mouth. (Patient not taking: Reported on 03/08/2023)   Not Taking   valACYclovir (VALTREX) 1000 MG tablet Take by mouth. (Patient not taking: Reported on 03/08/2023)   Not Taking   WEGOVY 2.4 MG/0.75ML SOAJ SMARTSIG:2.4 Milligram(s) SUB-Q Once a Week   03/01/2023   Home medication reconciliation was completed with the patient.   Scheduled Inpatient Medications:    aMILoride  5 mg Oral Daily   atenolol  100 mg Oral Daily    cetirizine  5 mg Oral [id number]   enoxaparin (LOVENOX) injection  40 mg Subcutaneous Q24H   pantoprazole (PROTONIX) IV  40 mg Intravenous Q12H   senna-docusate  1 tablet Oral BID   topiramate  50 mg Oral Daily    Continuous Inpatient Infusions:    PRN Inpatient Medications:  acetaminophen **OR** acetaminophen, albuterol, alum & mag hydroxide-simeth, bisacodyl, butalbital-acetaminophen-caffeine, cyclobenzaprine, diphenhydrAMINE, labetalol, ondansetron **OR** ondansetron (ZOFRAN) IV, oxyCODONE-acetaminophen, SUMAtriptan, zolpidem  Family History: family history includes CAD in her father; Lung cancer in her father.  The patient's family history is negative for inflammatory bowel disorders, GI malignancy, or solid organ transplantation.  Social History:   reports that she quit smoking about 7 years ago. Her smoking use included cigarettes. She has a 7.50 pack-year smoking history. She has never used smokeless tobacco. She reports that she does not drink alcohol and does not use drugs. The patient denies ETOH, tobacco, or drug use.   Review of Systems: Constitutional: Weight is stable.  Eyes: No changes in vision. ENT: No oral lesions, sore throat.  GI: see HPI.  Heme/Lymph: No easy bruising.  CV: No chest pain.  GU: No hematuria.  Integumentary: No rashes.  Neuro: No headaches.  Psych: No depression/anxiety.  Endocrine: No heat/cold intolerance.  Allergic/Immunologic: No urticaria.  Resp: No cough, SOB.  Musculoskeletal: No joint swelling.    Physical Examination: BP 104/63 (BP Location: Left Arm)   Pulse 77   Temp 97.9 F (36.6 C)   Resp 18   Ht 5\' 5"  (1.651 m)   Wt 70.3 kg   LMP 04/04/2017   SpO2 98%   BMI 25.79 kg/m  Gen: NAD, alert and oriented x 4 HEENT: PEERLA, EOMI, Neck: supple, no JVD or thyromegaly Chest: CTA bilaterally, no wheezes, crackles, or other adventitious sounds CV: RRR, no m/g/c/r Abd: soft, ND, +BS in all four quadrants; tender to deep palpation  in all four quadrants, no HSM, guarding, ridigity, or rebound tenderness Ext: no edema, well perfused with 2+ pulses, Skin: no rash or lesions noted Lymph: no LAD  Data: Lab Results  Component Value Date   WBC 3.2 (L) 03/09/2023   HGB 11.5 (L) 03/09/2023   HCT 33.8 (L) 03/09/2023   MCV 98.3 03/09/2023   PLT 211 03/09/2023   Recent Labs  Lab 03/08/23 0009 03/09/23 0552  HGB 12.7 11.5*   Lab Results  Component Value Date  NA 141 03/11/2023   K 4.0 03/11/2023   CL 110 03/11/2023   CO2 24 03/11/2023   BUN 14 03/11/2023   CREATININE 0.92 03/11/2023   Lab Results  Component Value Date   ALT 14 03/08/2023   AST 17 03/08/2023   ALKPHOS 68 03/08/2023   BILITOT 0.4 03/08/2023   No results for input(s): "APTT", "INR", "PTT" in the last 168 hours.  Assessment/Plan:  42 y/o Caucasian female with a PMH of HTN, GERD, migraines, s/p CCY, and hx of Roux-en-Y gastric bypass presented to the [facility] ED 4/3 for chief complaint of diffuse abdominal pain and bilateral flank pain with associated nausea and decreased PO intake. GI consulted for further evaluation and management.   Diffuse abdominal pain - DDx includes pGOO, esophagitis, gastritis, peptic ulcer disease, functional dyspepsia, gastroparesis, medication side effects from GLP-1 agonist therapy, MSK, IBS, neoplasm, etc  Intractable nausea without vomiting  AKI - improving with IV fluid hydration   Hyponatremia - resolved  GERD  S/p gastric sleeve, hiatal hernia repair, CCY  Overuse of NSAIDs  Recommendations:  - H&H stable with no signs of hemodynamic changes. No overt GIB.  - Continue supportive care with IV fluid hydration, antiemetics, and pain control prn - Continue Protonix IV BID for gastric protection - Strict anti-reflux precautions reviewed with patient  - Avoid NSAIDs - Will plan on EGD tomorrow morning with Dr. Norma Fredrickson given anesthesia availability  - Continue regular diet today. NPO midnight.  - EGD  tomorrow with Dr. Norma Fredrickson. See procedure note for findings and further recs  I reviewed the risks (including bleeding, perforation, infection, anesthesia complications, cardiac/respiratory complications), benefits and alternatives of EGD. Patient consents to proceed.    Thank you for the consult. Please call with questions or concerns.  Gilda Crease, PA-C [facility] [phone]

## 2023-03-11 NOTE — H&P (View-Only) (Signed)
  GI Inpatient Consult Note  Reason for Consult: Persistent abdominal pain, intractable nausea    Attending Requesting Consult: Dr. Kelly Griffith   History of Present Illness: Patricia Gonzalez is a 42 y.o. female seen for evaluation of persistent, unexplained abdominal pain with intractable nausea at the request of hospitalist - Dr. Kelly Griffith. Patient has a PMH of HTN, GERD, migraines, s/p CCY, and hx of Roux-en-Y gastric bypass. She presented to the [facility] ED 4/3 for chief complaint of diffuse abdominal pain and bilateral flank pain with associated nausea and decreased PO intake. Upon presentation to the ED, all vital signs were within normal limits. Labs significant for Na 133, serum creatinine 1.53, BUN 24. She had CT renal stone protocol showed bilateral perinephric stranding concerning for possible pyelonephritis versus AKI. Nephrology was consulted and pyelonephritis was ruled out. UA benign and urine culture negative. She has been treated with IV fluid hydration, antiemetics, and pain medicine. IV PPI BID has been given due to hx of GERD. GI consulted for further evaluation and management.   Patient seen and examined this morning resting comfortably in hospital bed. No acute events overnight. She reports she has been unable to eat or drink most things due to severe nausea and abdominal pain. She is no longer having the back pain. She reports her abdomen hurts all over and is present in all four quadrants. She reports that moving around in the bed and coughing seem to exacerbate the pain. There is no correlation to defecation. She hasn't had a bowel movement in the last 2 days. Normally at home she has 1 BM a day. She denies any hematochezia or melanotic stool. She denies any associated vomiting. She feels like food is sitting at top of her stomach. No esophageal dysphagia or odynophagia symptoms. GERD is uncontrolled at baseline. Her insurance stopped covering Dexilant so she has been  started on Prevacid. PO Protonix has not been helpful in the past for her. She is s/p cholecystectomy and lap sleeve gastrectomy and hiatal hernia repair 12/2016. She is on Wegovy injections weekly and has been on them for the past year. She has lost 20-lbs on the GLP-1 agonist therapy. She takes occasional BC powder during the week for headaches. She denies any known family history of chronic GI illnesses. She was only able to have a piece of toast yesterday. Antiemetics are working well currently.    CSY 12/09/2018 (Vanga) - preparation of the colon was fair, normal examined mucosa in entire examined colon, single 3 mm solitary ulcer in rectum seen upon retroflexion representing previous banding site without bleeding   Past Medical History:  Past Medical History:  Diagnosis Date   GERD (gastroesophageal reflux disease)    Headache    Hypertension     Problem List: Patient Active Problem List   Diagnosis Date Noted   Hyponatremia 03/09/2023   Hypokalemia 03/09/2023   AKI (acute kidney injury) 03/08/2023   Ovarian cyst, left 03/08/2023   GERD (gastroesophageal reflux disease) 03/08/2023   Insomnia 03/08/2023   Abdominal pain, generalized 01/03/2019   Acute pyelonephritis 01/03/2019   Acute renal failure (ARF) 01/03/2019   Rectal bleeding    Intractable chronic migraine without aura and without status migrainosus 01/18/2017   Morbid obesity 01/02/2017   Obstructive sleep apnea syndrome 07/11/2016   Diabetes mellitus 05/09/2016   Essential hypertension 05/09/2016   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    Past Surgical History: Past Surgical History:  Procedure Laterality Date     CHOLECYSTECTOMY     COLONOSCOPY     COLONOSCOPY WITH PROPOFOL N/A 12/09/2018   Procedure: COLONOSCOPY WITH PROPOFOL;  Surgeon: Vanga, Rohini Reddy, MD;  Location: [facility] ENDOSCOPY;  Service: Gastroenterology;  Laterality: N/A;   CYST EXCISION     LAPAROSCOPIC GASTRIC SLEEVE RESECTION WITH  HIATAL HERNIA REPAIR  01/02/2017   Procedure: LAPAROSCOPIC GASTRIC SLEEVE RESECTION WITH HIATAL HERNIA REPAIR;  Surgeon: Jon M Bruce, MD;  Location: [facility] ORS;  Service: General;;    Allergies: Allergies  Allergen Reactions   Ketorolac Tromethamine Anaphylaxis    Shots   Morphine Other (See Comments)    Caused stomach pain    Home Medications: Medications Prior to Admission  Medication Sig Dispense Refill Last Dose   albuterol (VENTOLIN HFA) 108 (90 Base) MCG/ACT inhaler Inhale 1-2 puffs into the lungs every 6 (six) hours as needed for wheezing or shortness of breath.   unknown   aMILoride (MIDAMOR) 5 MG tablet Take 5 mg by mouth daily.   03/07/2023   atenolol (TENORMIN) 100 MG tablet Take 100 mg by mouth daily.   03/07/2023   butalbital-acetaminophen-caffeine (FIORICET, ESGIC) 50-325-40 MG tablet Take 1-2 tablets by mouth every 4 (four) hours as needed for headache.   unknown   cyclobenzaprine (FLEXERIL) 10 MG tablet Take 10 mg by mouth every 8 (eight) hours as needed for muscle spasms.   Past Week   dexlansoprazole (DEXILANT) 60 MG capsule Take 60 mg by mouth daily.   03/07/2023   levocetirizine (XYZAL) 5 MG tablet Take 5 mg by mouth every morning.   03/07/2023   lisinopril-hydrochlorothiazide (PRINZIDE,ZESTORETIC) 20-25 MG tablet Take 1 tablet by mouth daily.   03/07/2023   methocarbamol (ROBAXIN) 500 MG tablet Take 1 tablet (500 mg total) by mouth 2 (two) times daily. 20 tablet 0 03/07/2023   omeprazole (PRILOSEC) 40 MG capsule take 1 capsule by mouth once daily for REFLUX   03/07/2023   propranolol ER (INDERAL LA) 60 MG 24 hr capsule Take 60 mg by mouth daily.   03/07/2023   rizatriptan (MAXALT-MLT) 10 MG disintegrating tablet rizatriptan 10 mg disintegrating tablet  take 1 tablet by mouth ONCE AS NEEDED FOR MIGRAINE FOR UP TO 1 DO...  (REFER TO PRESCRIPTION NOTES).   unknown   SUMAtriptan (IMITREX) 100 MG tablet Take 100 mg by mouth every 2 (two) hours as needed for migraine. May repeat in 2 hours if  headache persists or recurs.   unknown   topiramate (TOPAMAX) 25 MG tablet Take 50 mg by mouth daily.  1 03/07/2023   tretinoin (RETIN-A) 0.025 % cream tretinoin 0.025 % topical cream   Past Week   valACYclovir (VALTREX) 500 MG tablet Take 2,000 mg by mouth 2 (two) times daily as needed.   Past Week   zolpidem (AMBIEN) 10 MG tablet TK 1 T PO IMMEDIATELY BEFORE BEDTIME FOR SLEEP  5 03/07/2023   10894 Take 15 mL every 4 hours by oral route.      amoxicillin-clavulanate (AUGMENTIN) 875-125 MG tablet Take 1 tablet by mouth every 12 (twelve) hours. (Patient not taking: Reported on 03/08/2023) 14 tablet 0 Not Taking   BD PEN NEEDLE NANO 2ND GEN 32G X 4 MM MISC Inject into the skin.      ibuprofen (IBU) 800 MG tablet take 1 tablet by mouth every 8 hours if needed for pain (Patient not taking: Reported on 03/08/2023)   Not Taking   influenza vac split quadrivalent PF (FLUARIX) 0.5 ML injection inject 0.5 milliliter intramuscularly        methocarbamol (ROBAXIN) 500 MG tablet Take 500 mg by mouth 2 (two) times daily. (Patient not taking: Reported on 03/08/2023)   Not Taking   oxyCODONE (OXY IR/ROXICODONE) 5 MG immediate release tablet Take by mouth. (Patient not taking: Reported on 03/08/2023)   Not Taking   PREVIDENT 5000 SENSITIVE 1.1-5 % PSTE BRUSH WITH PREVIDENT 2 TIMES A DAY (Patient not taking: Reported on 03/08/2023)  5 Not Taking   silver sulfADIAZINE (SSD) 1 % cream apply to affected area once daily (Patient not taking: Reported on 03/08/2023)   Not Taking   SUMAtriptan (IMITREX) 100 MG tablet Take by mouth. (Patient not taking: Reported on 03/08/2023)   Not Taking   valACYclovir (VALTREX) 1000 MG tablet Take by mouth. (Patient not taking: Reported on 03/08/2023)   Not Taking   WEGOVY 2.4 MG/0.75ML SOAJ SMARTSIG:2.4 Milligram(s) SUB-Q Once a Week   03/01/2023   Home medication reconciliation was completed with the patient.   Scheduled Inpatient Medications:    aMILoride  5 mg Oral Daily   atenolol  100 mg Oral Daily    cetirizine  5 mg Oral [id number]   enoxaparin (LOVENOX) injection  40 mg Subcutaneous Q24H   pantoprazole (PROTONIX) IV  40 mg Intravenous Q12H   senna-docusate  1 tablet Oral BID   topiramate  50 mg Oral Daily    Continuous Inpatient Infusions:    PRN Inpatient Medications:  acetaminophen **OR** acetaminophen, albuterol, alum & mag hydroxide-simeth, bisacodyl, butalbital-acetaminophen-caffeine, cyclobenzaprine, diphenhydrAMINE, labetalol, ondansetron **OR** ondansetron (ZOFRAN) IV, oxyCODONE-acetaminophen, SUMAtriptan, zolpidem  Family History: family history includes CAD in her father; Lung cancer in her father.  The patient's family history is negative for inflammatory bowel disorders, GI malignancy, or solid organ transplantation.  Social History:   reports that she quit smoking about 7 years ago. Her smoking use included cigarettes. She has a 7.50 pack-year smoking history. She has never used smokeless tobacco. She reports that she does not drink alcohol and does not use drugs. The patient denies ETOH, tobacco, or drug use.   Review of Systems: Constitutional: Weight is stable.  Eyes: No changes in vision. ENT: No oral lesions, sore throat.  GI: see HPI.  Heme/Lymph: No easy bruising.  CV: No chest pain.  GU: No hematuria.  Integumentary: No rashes.  Neuro: No headaches.  Psych: No depression/anxiety.  Endocrine: No heat/cold intolerance.  Allergic/Immunologic: No urticaria.  Resp: No cough, SOB.  Musculoskeletal: No joint swelling.    Physical Examination: BP 104/63 (BP Location: Left Arm)   Pulse 77   Temp 97.9 F (36.6 C)   Resp 18   Ht 5' 5" (1.651 m)   Wt 70.3 kg   LMP 04/04/2017   SpO2 98%   BMI 25.79 kg/m  Gen: NAD, alert and oriented x 4 HEENT: PEERLA, EOMI, Neck: supple, no JVD or thyromegaly Chest: CTA bilaterally, no wheezes, crackles, or other adventitious sounds CV: RRR, no m/g/c/r Abd: soft, ND, +BS in all four quadrants; tender to deep palpation  in all four quadrants, no HSM, guarding, ridigity, or rebound tenderness Ext: no edema, well perfused with 2+ pulses, Skin: no rash or lesions noted Lymph: no LAD  Data: Lab Results  Component Value Date   WBC 3.2 (L) 03/09/2023   HGB 11.5 (L) 03/09/2023   HCT 33.8 (L) 03/09/2023   MCV 98.3 03/09/2023   PLT 211 03/09/2023   Recent Labs  Lab 03/08/23 0009 03/09/23 0552  HGB 12.7 11.5*   Lab Results  Component Value Date     NA 141 03/11/2023   K 4.0 03/11/2023   CL 110 03/11/2023   CO2 24 03/11/2023   BUN 14 03/11/2023   CREATININE 0.92 03/11/2023   Lab Results  Component Value Date   ALT 14 03/08/2023   AST 17 03/08/2023   ALKPHOS 68 03/08/2023   BILITOT 0.4 03/08/2023   No results for input(s): "APTT", "INR", "PTT" in the last 168 hours.  Assessment/Plan:  42 y/o Caucasian female with a PMH of HTN, GERD, migraines, s/p CCY, and hx of Roux-en-Y gastric bypass presented to the [facility] ED 4/3 for chief complaint of diffuse abdominal pain and bilateral flank pain with associated nausea and decreased PO intake. GI consulted for further evaluation and management.   Diffuse abdominal pain - DDx includes pGOO, esophagitis, gastritis, peptic ulcer disease, functional dyspepsia, gastroparesis, medication side effects from GLP-1 agonist therapy, MSK, IBS, neoplasm, etc  Intractable nausea without vomiting  AKI - improving with IV fluid hydration   Hyponatremia - resolved  GERD  S/p gastric sleeve, hiatal hernia repair, CCY  Overuse of NSAIDs  Recommendations:  - H&H stable with no signs of hemodynamic changes. No overt GIB.  - Continue supportive care with IV fluid hydration, antiemetics, and pain control prn - Continue Protonix IV BID for gastric protection - Strict anti-reflux precautions reviewed with patient  - Avoid NSAIDs - Will plan on EGD tomorrow morning with Dr. Toledo given anesthesia availability  - Continue regular diet today. NPO midnight.  - EGD  tomorrow with Dr. Toledo. See procedure note for findings and further recs  I reviewed the risks (including bleeding, perforation, infection, anesthesia complications, cardiac/respiratory complications), benefits and alternatives of EGD. Patient consents to proceed.    Thank you for the consult. Please call with questions or concerns.  Jacqueli Pangallo M Farah Benish, PA-C [facility] [phone]  

## 2023-03-11 NOTE — Assessment & Plan Note (Signed)
Metapneumovirus infection 4/7 - pt had temp of 100 F around 530 this AM. States she felt bad at the time, has another HA, also has new congestion this AM. No sore throat. Reports bad seasonal allergies.   4/8 - worsened congestion --Negative Covid  --Respiratory Viral Panel with +Metapneumovirus --Supportive care

## 2023-03-11 NOTE — Progress Notes (Addendum)
Progress Note   Patient: Patricia Gonzalez XUX:[id number] DOB: 1981/11/05 DOA: 03/08/2023     2 DOS: the patient was seen and examined on 03/11/2023   Brief hospital course: Patricia Gonzalez is a 42 y.o. female with medical history significant of hypertension, GERD, s/p Roux-en-Y, migraines who presented to the ED around midnight 03/08/2023 for evaluation of bilateral flank and generalized abdominal pain with nausea. No vomiting or fever/chills.  Also reports poor PO intake due to the nausea.  She reports this illness resembles what she experienced in early 2020 when she presented with similar symptoms and was found to have AKI and signs of inflammation of bilateral kidneys on imaging.  She was seen by nephrology and followed up.  Has been doing well in the interim.    ED course -- vitals were normal. Labs were notable for sodium 133, BUN 24 and Cr 1.53 (baseline in Feb 2023 was 0.72). No leukocytosis on CBC. CT renal stone protocol showed bilateral perinephric stranding concerning for possible pyelonephritis vs AKI. Urinalysis was without signs of infection but sent for culture.   Admitted for further evaluation, IV fluids, antiemetics and pain management.    Consults - Nephrology   Assessment and Plan: * AKI (acute kidney injury) Cr 1.53 on admission. Baseline Cr 0.72 in Feb 2023. Hx of admission in 2020 for AKI and similar presenting complaints. Cr improved to 0.89 with IV fluids --Nephrology consulted --Monitor off IV fluids --Monitor BMP --Avoid nephrotoxins, renally dose meds  Elevated temperature 4/7 - pt had temp of 100 F around 530 this AM. States she felt bad at the time, has another HA, also has new congestion this AM. No sore throat. Reports bad seasonal allergies.   --Monitor closely for recurrent fever & symptoms of infection --Will do respiratory panel and further evaluation if needed --Negative procal and lactate, no leukocytosis, will monitor off antibiotics for  now  Hyponatremia Na on admission 133, mildly low. Resolved with IV hydration, consistent with hypovolemic hyponatremia likely due to poor PO intake with nausea. --Monitor BMP --Off IV fluids, tolerating PO hydration  Acute pyelonephritis Ruled Out.  UA benign and urine culture no growth. See AKI  Abdominal pain, generalized Bilateral flank pain Nausea Pyelonephritis - Ruled Out - UA benign, urine culture no growth. 4/6 - persistent abdominal pain, unable to eat.  No relief with GI cocktail. --GI consulted - saw pt today 4/7. Plan for EGD tomorrow AM for further eval --See AKI --Off IV fluids, pt able to drink fluids ok --Has diet ordered but minimal tolerance for food so far --NPO after midnight for EGD --Supportive care - antiemetics, pain mgmt --Added Maalox PRN for indigestion --IV PPI BID for now as pt notes poor efficacy with PO Protonix and her PO PPI is not on formulary here  Hypokalemia Resolved.  K 4.0 K 3.2 (4/5) - replaced.  Resolved. Monitor BMP, replace K PRN.  Insomnia Continue home Ambien  GERD (gastroesophageal reflux disease) Using IV PPI for now, pt states PO Protonix not effective for her.  Her home PPI not on formulary.  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  Flank pain is no worse on left than right per patient, so unlikely this contributes to her acute illness.   --On call Gynecologist recommended outpatient follow up  Intractable chronic migraine without aura and without status migrainosus Continue daily Topamax, PRN Maxalt or PRN  Fioricet  Essential hypertension Hold home antihypertensives for now, BP's are normal and soft at times.  Appears home regimen is amiloride 5 mg daily,  lisinopril/ HCTZ 20-25 mg daily, propranolol ER 60 mg daily.  (No longer takes atenolol). --Monitor BP's  --Stop / discontinue atenolol --Resume home amiloride --Holding lisinopril,  HCTZ, propranolol for now - resume gruadlly as needed --IV labetalol PRN for SBP>160        Subjective: Pt seen this AM.  She had temp of 100 earlier this AM and reports feeling quite bad at the time.  Has ongoing abdominal pain with really no improvement since admission.  GI's PA had just been in, pt reports they plan for EGD.  She reports headache again this AM, also has new congestion with postnasal drip and cough.  No sore throat or other new symptoms.     Physical Exam: Vitals:   03/10/23 2107 03/11/23 0523 03/11/23 0629 03/11/23 0817  BP: (!) 148/84 (!) 161/88  104/63  Pulse: 69 89  77  Resp: 19 18  18   Temp: (!) 97.5 F (36.4 C) 100 F (37.8 C) 99.7 F (37.6 C) 97.9 F (36.6 C)  TempSrc:  Oral Oral   SpO2: 100% 100%  98%  Weight:      Height:       General exam: awake, alert, appears uncomfortable in pain HEENT: moist mucus membranes, hearing grossly normal  Respiratory system: on room air, normal respiratory effort. Cardiovascular system: normal S1/S2, RRR, trace edema.   Gastrointestinal system: soft, epigastric tenderness with no rebound tenderness, non-distended Central nervous system: A&O x4. no gross focal neurologic deficits, normal speech Extremities: moves all, trace edema, normal tone Psychiatry: normal mood, congruent affect, judgement and insight appear normal   Data Reviewed:  Notable labs --- Cl 112, Ca 8.4 otherwise normal BMP.      Micro -- Urine culture no growth  Family Communication: None. Pt is able to update.  Disposition: Status is: Observation The patient remains OBS appropriate and will d/c before 2 midnights. Persistent severe abdominal pain and nausea requiring IV therapies and further evaluation. GI consult pending.  Not tolerating adequate PO yet.   Planned Discharge Destination: Home    Time spent: 38 minutes  Author: Pennie Banter, DO 03/11/2023 11:16 AM  For on call review [email or web address].

## 2023-03-12 ENCOUNTER — Inpatient Hospital Stay: Payer: 59 | Admitting: Anesthesiology

## 2023-03-12 ENCOUNTER — Encounter
Admission: EM | Disposition: A | Discharge status: Home / Self Care | Payer: Self-pay | Source: Home / Self Care | Attending: Internal Medicine

## 2023-03-12 ENCOUNTER — Encounter: Payer: Self-pay | Admitting: Internal Medicine

## 2023-03-12 HISTORY — PX: ESOPHAGOGASTRODUODENOSCOPY (EGD) WITH PROPOFOL: SHX5813

## 2023-03-12 LAB — RESPIRATORY PANEL BY PCR

## 2023-03-12 LAB — KAPPA/LAMBDA LIGHT CHAINS
Kappa free light chain: 20.4 mg/L — ABNORMAL HIGH (ref 3.3–19.4)
Kappa, lambda light chain ratio: 1.1 (ref 0.26–1.65)
Lambda free light chains: 18.6 mg/L (ref 5.7–26.3)

## 2023-03-12 LAB — BASIC METABOLIC PANEL
Anion gap: 5 (ref 5–15)
BUN: 14 mg/dL (ref 6–20)
CO2: 22 mmol/L (ref 22–32)
Calcium: 8.4 mg/dL — ABNORMAL LOW (ref 8.9–10.3)
Chloride: 113 mmol/L — ABNORMAL HIGH (ref 98–111)
Creatinine, Ser: 0.83 mg/dL (ref 0.44–1.00)
GFR, Estimated: >60 mL/min (ref >60)
Glucose, Bld: 84 mg/dL (ref 70–99)
Potassium: 4.7 mmol/L (ref 3.5–5.1)
Sodium: 140 mmol/L (ref 135–145)

## 2023-03-12 LAB — SARS CORONAVIRUS 2 BY RT PCR: SARS Coronavirus 2 by RT PCR: NEGATIVE

## 2023-03-12 SURGERY — ESOPHAGOGASTRODUODENOSCOPY (EGD) WITH PROPOFOL
Anesthesia: General

## 2023-03-12 MED ORDER — PROPOFOL 10 MG/ML IV BOLUS
INTRAVENOUS | Status: AC
  Filled 2023-03-12: qty 40

## 2023-03-12 MED ORDER — LISINOPRIL 20 MG PO TABS
20.0000 mg | ORAL_TABLET | Freq: Every day | ORAL | Status: DC
  Administered 2023-03-13: 20 mg via ORAL
  Filled 2023-03-12: qty 1

## 2023-03-12 MED ORDER — SODIUM CHLORIDE 0.9 % IV SOLN: INTRAVENOUS | Status: DC

## 2023-03-12 MED ORDER — PROPOFOL 10 MG/ML IV BOLUS
INTRAVENOUS | Status: DC | PRN
  Administered 2023-03-12 (×2): 20 mg via INTRAVENOUS
  Administered 2023-03-12: 30 mg via INTRAVENOUS
  Administered 2023-03-12: 70 mg via INTRAVENOUS
  Administered 2023-03-12: 30 mg via INTRAVENOUS

## 2023-03-12 MED ORDER — LIDOCAINE 2% (20 MG/ML) 5 ML SYRINGE
INTRAMUSCULAR | Status: DC | PRN
  Administered 2023-03-12: 50 mg via INTRAVENOUS

## 2023-03-12 NOTE — Anesthesia Preprocedure Evaluation (Signed)
Anesthesia Evaluation  Patient identified by MRN, date of birth, ID band Patient awake    Reviewed: Allergy & Precautions, H&P , NPO status , Patient's Chart, lab work & pertinent test results, reviewed documented beta blocker date and time   Airway Mallampati: II   Neck ROM: full    Dental  (+) Poor Dentition   Pulmonary sleep apnea and Continuous Positive Airway Pressure Ventilation , Patient abstained from smoking., former smoker   Pulmonary exam normal        Cardiovascular hypertension, negative cardio ROS Normal cardiovascular exam Rhythm:regular Rate:Normal     Neuro/Psych negative neurological ROS  negative psych ROS   GI/Hepatic Neg liver ROS,GERD  Medicated,,  Endo/Other  diabetes, Well Controlled    Renal/GU Renal disease  negative genitourinary   Musculoskeletal   Abdominal   Peds  Hematology negative hematology ROS (+)   Anesthesia Other Findings Past Medical History: No date: GERD (gastroesophageal reflux disease) No date: Headache No date: Hypertension Past Surgical History: No date: CHOLECYSTECTOMY No date: COLONOSCOPY 12/09/2018: COLONOSCOPY WITH PROPOFOL; N/A     Comment:  Procedure: COLONOSCOPY WITH PROPOFOL;  Surgeon: Toney Reil, MD;  Location: [facility] END[facility]COPY;  Service:               Gastroenterology;  Laterality: N/A; No date: CYST EXCISION 01/02/2017: LAPAROSCOPIC GASTRIC SLEEVE RESECTION WITH HIATAL HERNIA  REPAIR     Comment:  Procedure: LAPAROSCOPIC GASTRIC SLEEVE RESECTION WITH               HIATAL HERNIA REPAIR;  Surgeon: Geoffry Paradise, MD;                Location: [facility] ORS;  Service: General;; BMI    Body Mass Index: 25.79 kg/m     Reproductive/Obstetrics negative OB ROS                             Anesthesia Physical Anesthesia Plan  ASA: 3  Anesthesia Plan: General   Post-op Pain Management:    Induction:   PONV Risk  Score and Plan:   Airway Management Planned:   Additional Equipment:   Intra-op Plan:   Post-operative Plan:   Informed Consent: I have reviewed the patients History and Physical, chart, labs and discussed the procedure including the risks, benefits and alternatives for the proposed anesthesia with the patient or authorized representative who has indicated his/her understanding and acceptance.     Dental Advisory Given  Plan Discussed with: CRNA  Anesthesia Plan Comments:        Anesthesia Quick Evaluation

## 2023-03-12 NOTE — Interval H&P Note (Signed)
History and Physical Interval Note:  03/12/2023 2:34 PM  Patricia Gonzalez  has presented today for surgery, with the diagnosis of Diffuse abdominal pain, intractable nausea.  The various methods of treatment have been discussed with the patient and family. After consideration of risks, benefits and other options for treatment, the patient has consented to  Procedure(s): ESOPHAGOGASTRODUODENOSCOPY (EGD) WITH PROPOFOL (N/A) as a surgical intervention.  The patient's history has been reviewed, patient examined, no change in status, stable for surgery.  I have reviewed the patient's chart and labs.  Questions were answered to the patient's satisfaction.     Brevig [place], [place]

## 2023-03-12 NOTE — Op Note (Signed)
[facility] Patient Name: Patricia Gonzalez Procedure Date: 03/12/2023 2:41 PM MRN: [id number] Account #: [id number] Date of Birth: 1981-09-07 Admit Type: Inpatient Age: 42 Room: [facility] ENDO ROOM 2 Gender: Female Note Status: Finalized Instrument Name: Patton Salles Endoscope [id number] Procedure:             Upper GI endoscopy Indications:           Generalized abdominal pain, Nausea with vomiting Providers:             Boykin Nearing. Benna Arno MD, MD Medicines:             Propofol per Anesthesia Complications:         No immediate complications. Estimated blood loss: None. Procedure:             Pre-Anesthesia Assessment:                        - The risks and benefits of the procedure and the                         sedation options and risks were discussed with the                         patient. All questions were answered and informed                         consent was obtained.                        - Patient identification and proposed procedure were                         verified prior to the procedure by the nurse. The                         procedure was verified in the procedure room.                        - ASA Grade Assessment: II - A patient with mild                         systemic disease.                        - After reviewing the risks and benefits, the patient                         was deemed in satisfactory condition to undergo the                         procedure.                        After obtaining informed consent, the endoscope was                         passed under direct vision. Throughout the procedure,                         the patient's blood pressure, pulse, and oxygen  saturations were monitored continuously. The Endoscope                         was introduced through the mouth, and advanced to the                         third part of duodenum. The upper GI endoscopy was                          accomplished without difficulty. The patient tolerated                         the procedure well. Findings:      The examined esophagus was normal.      Scattered mild inflammation characterized by erythema was found in the       gastric body. Biopsies were taken with a cold forceps for Helicobacter       pylori testing.      A 3 cm hiatal hernia was present.      A small amount of food (residue) was found in the gastric fundus.      The exam of the stomach was otherwise normal.      The examined duodenum was normal. Impression:            - Normal esophagus.                        - Gastritis. Biopsied.                        - 3 cm hiatal hernia.                        - Normal examined duodenum. Recommendation:        - Return patient to hospital ward for possible                         discharge same day.                        - Advance diet as tolerated.                        - Await pathology results.                        - Return to GI office in 6 weeks.                        - Telephone GI office to schedule appointment.                        - The findings and recommendations were discussed with                         the patient. Procedure Code(s):     --- Professional ---                        [phone], Esophagogastroduodenoscopy, flexible,  transoral; with biopsy, single or multiple Diagnosis Code(s):     --- Professional ---                        R11.2, Nausea with vomiting, unspecified                        R10.84, Generalized abdominal pain                        K44.9, Diaphragmatic hernia without obstruction or                         gangrene                        K29.70, Gastritis, unspecified, without bleeding CPT copyright 2022 [employer]. All rights reserved. The codes documented in this report are preliminary and upon coder review may  be revised to meet current compliance requirements. Stanton Kidneyeodoro K Jaxxon Naeem MD,  MD 03/12/2023 2:53:36 PM This report has been signed electronically. Number of Addenda: 0 Note Initiated On: 03/12/2023 2:41 PM Estimated Blood Loss:  Estimated blood loss: none. Estimated blood loss: none.      [facility]

## 2023-03-12 NOTE — Transfer of Care (Signed)
Immediate Anesthesia Transfer of Care Note  Patient: Patricia Gonzalez  Procedure(s) Performed: ESOPHAGOGASTRODUODENOSCOPY (EGD) WITH PROPOFOL  Patient Location: Endoscopy Unit  Anesthesia Type:General  Level of Consciousness: drowsy  Airway & Oxygen Therapy: Patient Spontanous Breathing  Post-op Assessment: Report given to RN and Post -op Vital signs reviewed and stable  Post vital signs: Reviewed and stable  Last Vitals:  Vitals Value Taken Time  BP    Temp    Pulse    Resp    SpO2      Last Pain:  Vitals:   03/12/23 1431  TempSrc: Temporal  PainSc: 0-No pain      Patients Stated Pain Goal: 2 (03/10/23 1943)  Complications: No notable events documented.

## 2023-03-12 NOTE — Progress Notes (Signed)
Progress Note   Patient: Patricia Gonzalez XKG:[id number] DOB: 1981/03/10 DOA: 03/08/2023     3 DOS: the patient was seen and examined on 03/12/2023   Brief hospital course: QUINISHA CERMINARA is a 42 y.o. female with medical history significant of hypertension, GERD, s/p Roux-en-Y, migraines who presented to the ED around midnight 03/08/2023 for evaluation of bilateral flank and generalized abdominal pain with nausea. No vomiting or fever/chills.  Also reports poor PO intake due to the nausea.  She reports this illness resembles what she experienced in early 2020 when she presented with similar symptoms and was found to have AKI and signs of inflammation of bilateral kidneys on imaging.  She was seen by nephrology and followed up.  Has been doing well in the interim.    ED course -- vitals were normal. Labs were notable for sodium 133, BUN 24 and Cr 1.53 (baseline in Feb 2023 was 0.72). No leukocytosis on CBC. CT renal stone protocol showed bilateral perinephric stranding concerning for possible pyelonephritis vs AKI. Urinalysis was without signs of infection but sent for culture.   Admitted for further evaluation, IV fluids, antiemetics and pain management.    Consults - Nephrology   Assessment and Plan: * AKI (acute kidney injury) Cr 1.53 on admission. Baseline Cr 0.72 in Feb 2023. Hx of admission in 2020 for AKI and similar presenting complaints. Cr improved to 0.89 with IV fluids --Nephrology consulted --Monitor off IV fluids --Monitor BMP --Avoid nephrotoxins, renally dose meds  Elevated temperature ?Upper Respiratory Infection vs Seasonal Allergies 4/7 - pt had temp of 100 F around 530 this AM. States she felt bad at the time, has another HA, also has new congestion this AM. No sore throat. Reports bad seasonal allergies.   4/8 - worsened congestion --Will check for Covid & Respiratory Viral Panel --Symptomatic treatment per orders --Monitor closely for recurrent fever & symptoms of  infection --Negative procal and lactate, no leukocytosis, will monitor off antibiotics for now  Hyponatremia Na on admission 133, mildly low. Resolved with IV hydration, consistent with hypovolemic hyponatremia likely due to poor PO intake with nausea. --Monitor BMP --Off IV fluids, tolerating PO hydration  Acute pyelonephritis Ruled Out.  UA benign and urine culture no growth. See AKI  Abdominal pain, generalized Bilateral flank pain Nausea Pyelonephritis - Ruled Out - UA benign, urine culture no growth. 4/6 - persistent abdominal pain, unable to eat.  No relief with GI cocktail. 4/7 - GI consulted 4/8 - EGD today --Follow up GI's recommendations --See AKI --Off IV fluids, pt able to drink fluids ok --Has diet ordered but minimal tolerance for food so far --Diet per GI post-EGD --Supportive care - antiemetics, pain mgmt --Added Maalox PRN for indigestion --IV PPI BID for now as pt notes poor efficacy with PO Protonix and her PO PPI is not on formulary here  Hypokalemia Resolved.  K 4.7 K 3.2 (4/5) - replaced.  Resolved. Monitor BMP, replace K PRN.  Insomnia Continue home Ambien  GERD (gastroesophageal reflux disease) Using IV PPI for now, pt states PO Protonix not effective for her.  Her home PPI not on formulary.  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  Flank pain is no worse on left than right per patient, so unlikely this contributes to her acute illness.   --On call Gynecologist recommended outpatient follow up  Intractable chronic migraine without aura and without  status migrainosus Continue daily Topamax, PRN Maxalt or PRN Fioricet  Essential hypertension Hold home antihypertensives for now, BP's are normal and soft at times.  Appears home regimen is amiloride 5 mg daily,  lisinopril/ HCTZ 20-25 mg daily, propranolol ER 60 mg daily.  (No longer takes atenolol). --Monitor BP's   --Stop / discontinue atenolol --Continue home amiloride --Resume lisinopril tomorrow --Holding HCTZ, propranolol for now - resume gradually as needed --IV labetalol PRN for SBP>160        Subjective: Pt seen this AM.  Has worse congestion today.  Still having abdominal pain essentially unchanged.  Intermittent nausea but no vomiting.  Going for EGD this afternoon.  No other acute complaints.   Physical Exam: Vitals:   03/11/23 1605 03/11/23 1947 03/12/23 0503 03/12/23 0759  BP: 133/84 124/79 (!) 153/92 (!) 150/94  Pulse: 80 71 72 76  Resp: 16   20  Temp: 97.9 F (36.6 C) 98.8 F (37.1 C) 98.5 F (36.9 C) 98.7 F (37.1 C)  TempSrc:    Oral  SpO2: 100% 99% 99% 98%  Weight:      Height:       General exam: awake, alert, appears uncomfortable in pain HEENT: moist mucus membranes, hearing grossly normal  Respiratory system: on room air, normal respiratory effort. Cardiovascular system: normal S1/S2, RRR, trace edema.   Gastrointestinal system: soft, RUQ and epigastric tenderness with no rebound tenderness, non-distended Central nervous system: A&O x4. no gross focal neurologic deficits, normal speech Extremities: moves all, trace edema, normal tone Psychiatry: normal mood, congruent affect, judgement and insight appear normal   Data Reviewed:  Notable labs --- Cl 117, Ca 8.4 otherwise normal BMP.      Micro -- Urine culture no growth  Family Communication: None. Pt is able to update.  Disposition: Status is: Inpatient Remains inpatient appropriate because: Persistent severe abdominal pain and nausea requiring IV therapies and further evaluation including EGD today.   Planned Discharge Destination: Home    Time spent: 36 minutes  Author: Pennie Banter, DO 03/12/2023 1:15 PM  For on call review [email or web address].

## 2023-03-12 NOTE — Anesthesia Procedure Notes (Signed)
Date/Time: 03/12/2023 2:39 PM  Performed by: Irving Burton, CRNAPre-anesthesia Checklist: Patient identified, Emergency Drugs available, Suction available and Patient being monitored Patient Re-evaluated:Patient Re-evaluated prior to induction Oxygen Delivery Method: Circle system utilized and Nasal cannula Preoxygenation: Pre-oxygenation with 100% oxygen Induction Type: IV induction Ventilation: Mask ventilation without difficulty and Mask ventilation throughout procedure Airway Equipment and Method: Bite block Placement Confirmation: positive ETCO2 Dental Injury: Teeth and Oropharynx as per pre-operative assessment

## 2023-03-13 ENCOUNTER — Encounter: Payer: Self-pay | Admitting: Internal Medicine

## 2023-03-13 LAB — CBC
HCT: 27.2 % — ABNORMAL LOW (ref 36.0–46.0)
Hemoglobin: 9.3 g/dL — ABNORMAL LOW (ref 12.0–15.0)
MCH: 33.3 pg (ref 26.0–34.0)
MCHC: 34.2 g/dL (ref 30.0–36.0)
MCV: 97.5 fL (ref 80.0–100.0)
Platelets: 147 10*3/uL — ABNORMAL LOW (ref 150–400)
RBC: 2.79 MIL/uL — ABNORMAL LOW (ref 3.87–5.11)
RDW: 12.5 % (ref 11.5–15.5)
WBC: 2.6 10*3/uL — ABNORMAL LOW (ref 4.0–10.5)
nRBC: 0 % (ref 0.0–0.2)

## 2023-03-13 LAB — ANCA PROFILE
Anti-MPO Antibodies: <0.2 units (ref 0.0–0.9)
Anti-PR3 Antibodies: <0.2 units (ref 0.0–0.9)
Atypical P-ANCA titer: <1:20 {titer}
C-ANCA: <1:20 {titer}
P-ANCA: <1:20 {titer}

## 2023-03-13 LAB — BASIC METABOLIC PANEL
Anion gap: 4 — ABNORMAL LOW (ref 5–15)
BUN: 14 mg/dL (ref 6–20)
CO2: 21 mmol/L — ABNORMAL LOW (ref 22–32)
Calcium: 8.1 mg/dL — ABNORMAL LOW (ref 8.9–10.3)
Chloride: 116 mmol/L — ABNORMAL HIGH (ref 98–111)
Creatinine, Ser: 0.75 mg/dL (ref 0.44–1.00)
GFR, Estimated: >60 mL/min (ref >60)
Glucose, Bld: 103 mg/dL — ABNORMAL HIGH (ref 70–99)
Potassium: 3.9 mmol/L (ref 3.5–5.1)
Sodium: 141 mmol/L (ref 135–145)

## 2023-03-13 LAB — PROTEIN ELECTROPHORESIS, SERUM
A/G Ratio: 1.4 (ref 0.7–1.7)
Albumin ELP: 3.4 g/dL (ref 2.9–4.4)
Alpha-1-Globulin: 0.3 g/dL (ref 0.0–0.4)
Alpha-2-Globulin: 0.7 g/dL (ref 0.4–1.0)
Beta Globulin: 0.8 g/dL (ref 0.7–1.3)
Gamma Globulin: 0.7 g/dL (ref 0.4–1.8)
Globulin, Total: 2.5 g/dL (ref 2.2–3.9)
Total Protein ELP: 5.9 g/dL — ABNORMAL LOW (ref 6.0–8.5)

## 2023-03-13 MED ORDER — OXYCODONE-ACETAMINOPHEN 5-325 MG PO TABS: 1.0000 | ORAL_TABLET | Freq: Four times a day (QID) | ORAL | 0 refills | Status: DC | PRN

## 2023-03-13 MED ORDER — BISACODYL 5 MG PO TBEC: 5.0000 mg | DELAYED_RELEASE_TABLET | Freq: Every day | ORAL | 0 refills | Status: AC | PRN

## 2023-03-13 MED ORDER — ONDANSETRON 8 MG PO TBDP: 8.0000 mg | ORAL_TABLET | Freq: Three times a day (TID) | ORAL | 0 refills | Status: DC | PRN

## 2023-03-13 MED ORDER — DOCUSATE SODIUM 100 MG PO CAPS: 100.0000 mg | ORAL_CAPSULE | Freq: Two times a day (BID) | ORAL | 0 refills | Status: AC

## 2023-03-13 MED ORDER — SUCRALFATE 1 GM/10ML PO SUSP: 1.0000 g | Freq: Three times a day (TID) | ORAL | Status: DC

## 2023-03-13 MED ORDER — SUCRALFATE 1 GM/10ML PO SUSP: 1.0000 g | Freq: Three times a day (TID) | ORAL | 0 refills | Status: AC

## 2023-03-13 MED ORDER — ALUM & MAG HYDROXIDE-SIMETH 200-200-20 MG/5ML PO SUSP: 30.0000 mL | Freq: Four times a day (QID) | ORAL | 0 refills | Status: AC | PRN

## 2023-03-13 MED ORDER — PROMETHAZINE HCL 6.25 MG/5ML PO SYRP: 12.5000 mg | ORAL_SOLUTION | Freq: Four times a day (QID) | ORAL | 0 refills | Status: AC | PRN

## 2023-03-13 MED ORDER — DM-GUAIFENESIN ER 30-600 MG PO TB12: 1.0000 | ORAL_TABLET | Freq: Two times a day (BID) | ORAL | 0 refills | Status: AC

## 2023-03-13 MED ORDER — SUCRALFATE 1 GM/10ML PO SUSP
1.0000 g | Freq: Three times a day (TID) | ORAL | Status: DC
  Administered 2023-03-13: 1 g via ORAL
  Filled 2023-03-13: qty 10

## 2023-03-13 NOTE — Discharge Summary (Signed)
Physician Discharge Summary   Patient: Patricia Gonzalez MRN: [id number] DOB: 1981/03/28  Admit date:     03/08/2023  Discharge date: 03/13/23  Discharge Physician: Pennie Banter   PCP: Emogene Morgan, MD   Recommendations at discharge:    Follow up with GI Follow up pending H pylori gastric biopsies Follow up with Primary Care Repeat BMP, Mg, CBC in 1-2 weeks   Discharge Diagnoses: Active Problems:   Abdominal pain, generalized   Elevated temperature   Essential hypertension   Intractable chronic migraine without aura and without status migrainosus   Ovarian cyst, left   GERD (gastroesophageal reflux disease)   Insomnia  Principal Problem (Resolved):   AKI (acute kidney injury) Resolved Problems:   Acute pyelonephritis   Hyponatremia   Hypokalemia  Hospital Course: Patricia Gonzalez is a 42 y.o. female with medical history significant of hypertension, GERD, s/p Roux-en-Y, migraines who presented to the ED around midnight 03/08/2023 for evaluation of bilateral flank and generalized abdominal pain with nausea. No vomiting or fever/chills.  Also reports poor PO intake due to the nausea.  She reports this illness resembles what she experienced in early 2020 when she presented with similar symptoms and was found to have AKI and signs of inflammation of bilateral kidneys on imaging.  She was seen by nephrology and followed up.  Has been doing well in the interim.    ED course -- vitals were normal. Labs were notable for sodium 133, BUN 24 and Cr 1.53 (baseline in Feb 2023 was 0.72). No leukocytosis on CBC. CT renal stone protocol showed bilateral perinephric stranding concerning for possible pyelonephritis vs AKI. Urinalysis was without signs of infection but sent for culture.   Admitted for further evaluation, IV fluids, antiemetics and pain management.    Consults - Nephrology   Assessment and Plan: * AKI (acute kidney injury)-resolved as of 03/13/2023 Cr 1.53 on  admission. Baseline Cr 0.72 in Feb 2023. Hx of admission in 2020 for AKI and similar presenting complaints. Cr improved to 0.89 with IV fluids --Nephrology consulted --Monitor off IV fluids --Monitor BMP --Avoid nephrotoxins, renally dose meds  Abdominal pain, generalized Gastritis, Nausea Bilateral flank pain Pyelonephritis - Ruled Out - UA benign, urine culture no growth. 4/6 - persistent abdominal pain, unable to eat.  No relief with GI cocktail. 4/7 - GI consulted 4/8 - EGD showed gastritis --Follow up GI's recommendations --See AKI --Beginning to tolerate solid foods but still with abdominal pain, tolerating liquids wlel --Resume home PPI regimen --Follow H pylori biopsies --Follow up with GI as instructed --Percocet PRN, Phenergan or Zofran PRN for nausea, Carafate for gastric protection  Hyponatremia-resolved as of 03/13/2023 Na on admission 133, mildly low. Resolved with IV hydration, consistent with hypovolemic hyponatremia likely due to poor PO intake with nausea. --Monitor BMP --Off IV fluids, tolerating PO hydration  Acute pyelonephritis-resolved as of 03/13/2023 Ruled Out.  UA benign and urine culture no growth. See AKI  Elevated temperature Metapneumovirus infection 4/7 - pt had temp of 100 F around 530 this AM. States she felt bad at the time, has another HA, also has new congestion this AM. No sore throat. Reports bad seasonal allergies.   4/8 - worsened congestion --Negative Covid  --Respiratory Viral Panel with +Metapneumovirus --Supportive care  Insomnia Continue home Ambien  GERD (gastroesophageal reflux disease) Using IV PPI for now, pt states PO Protonix not effective for her.  Her home PPI not on formulary.  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  Flank pain is no worse on left than right per patient, so unlikely this contributes to her acute illness.   --On call  Gynecologist recommended outpatient follow up  Intractable chronic migraine without aura and without status migrainosus Continue daily Topamax, PRN Maxalt or PRN Fioricet  Essential hypertension Hold home antihypertensives for now, BP's are normal and soft at times.  Appears home regimen is amiloride 5 mg daily,  lisinopril/ HCTZ 20-25 mg daily, propranolol ER 60 mg daily.  (No longer takes atenolol). --Monitor BP's  --Stop / discontinue atenolol --Continue home amiloride --Resume lisinopril tomorrow --Holding HCTZ, propranolol for now - resume gradually as needed --IV labetalol PRN for SBP>160  Hypokalemia-resolved as of 03/13/2023 Resolved.  K 4.7 K 3.2 (4/5) - replaced.  Resolved. Monitor BMP, replace K PRN.         Consultants: Gastroenterology Procedures performed: EGD  Disposition: Home Diet recommendation:  Discharge Diet Orders (From admission, onward)     Start     Ordered   03/13/23 0000  Diet - low sodium heart healthy        03/13/23 1355           Cardiac diet DISCHARGE MEDICATION: Allergies as of 03/13/2023       Reactions   Ketorolac Tromethamine Anaphylaxis   Shots   Morphine Other (See Comments)   Caused stomach pain        Medication List     STOP taking these medications    amoxicillin-clavulanate 875-125 MG tablet Commonly known as: AUGMENTIN   atenolol 100 MG tablet Commonly known as: TENORMIN   IBU 800 MG tablet Generic drug: ibuprofen   oxyCODONE 5 MG immediate release tablet Commonly known as: Oxy IR/ROXICODONE   PreviDent 5000 Sensitive 1.1-5 % Pste Generic drug: Sod Fluoride-Potassium Nitrate   SSD 1 % cream Generic drug: silver sulfADIAZINE   Wegovy 2.4 MG/0.75ML Soaj Generic drug: Semaglutide-Weight Management       TAKE these medications    10894 Take 15 mL every 4 hours by oral route.   albuterol 108 (90 Base) MCG/ACT inhaler Commonly known as: VENTOLIN HFA Inhale 1-2 puffs into the lungs every 6 (six)  hours as needed for wheezing or shortness of breath.   alum & mag hydroxide-simeth 200-200-20 MG/5ML suspension Commonly known as: MAALOX/MYLANTA Take 30 mLs by mouth every 6 (six) hours as needed for indigestion or heartburn.   aMILoride 5 MG tablet Commonly known as: MIDAMOR Take 5 mg by mouth daily.   BD Pen Needle Nano 2nd Gen 32G X 4 MM Misc Generic drug: Insulin Pen Needle Inject into the skin.   bisacodyl 5 MG EC tablet Commonly known as: DULCOLAX Take 1 tablet (5 mg total) by mouth daily as needed for moderate constipation.   butalbital-acetaminophen-caffeine 50-325-40 MG tablet Commonly known as: FIORICET Take 1-2 tablets by mouth every 4 (four) hours as needed for headache.   cyclobenzaprine 10 MG tablet Commonly known as: FLEXERIL Take 10 mg by mouth every 8 (eight) hours as needed for muscle spasms.   Dexilant 60 MG capsule Generic drug: dexlansoprazole Take 60 mg by mouth daily.   dextromethorphan-guaiFENesin 30-600 MG 12hr tablet Commonly known as: MUCINEX DM Take 1 tablet by mouth 2 (two) times daily for 10 days.   docusate sodium 100 MG capsule Commonly known as: COLACE Take 1 capsule (100 mg total) by mouth 2 (two) times  daily.   influenza vac split quadrivalent PF 0.5 ML injection Commonly known as: FLUARIX inject 0.5 milliliter intramuscularly   levocetirizine 5 MG tablet Commonly known as: XYZAL Take 5 mg by mouth every morning.   lisinopril-hydrochlorothiazide 20-25 MG tablet Commonly known as: ZESTORETIC Take 1 tablet by mouth daily.   methocarbamol 500 MG tablet Commonly known as: ROBAXIN Take 1 tablet (500 mg total) by mouth 2 (two) times daily. What changed: Another medication with the same name was removed. Continue taking this medication, and follow the directions you see here.   omeprazole 40 MG capsule Commonly known as: PRILOSEC take 1 capsule by mouth once daily for REFLUX   ondansetron 8 MG disintegrating tablet Commonly  known as: ZOFRAN-ODT Take 1 tablet (8 mg total) by mouth every 8 (eight) hours as needed for nausea or vomiting.   oxyCODONE-acetaminophen 5-325 MG tablet Commonly known as: PERCOCET/ROXICET Take 1-2 tablets by mouth every 6 (six) hours as needed for moderate pain or severe pain.   promethazine 6.25 MG/5ML syrup Commonly known as: PHENERGAN Take 10 mLs (12.5 mg total) by mouth every 6 (six) hours as needed for nausea or vomiting.   propranolol ER 60 MG 24 hr capsule Commonly known as: INDERAL LA Take 60 mg by mouth daily.   rizatriptan 10 MG disintegrating tablet Commonly known as: MAXALT-MLT rizatriptan 10 mg disintegrating tablet  take 1 tablet by mouth ONCE AS NEEDED FOR MIGRAINE FOR UP TO 1 DO...  (REFER TO PRESCRIPTION NOTES).   sucralfate 1 GM/10ML suspension Commonly known as: CARAFATE Take 10 mLs (1 g total) by mouth 4 (four) times daily -  with meals and at bedtime.   SUMAtriptan 100 MG tablet Commonly known as: IMITREX Take 100 mg by mouth every 2 (two) hours as needed for migraine. May repeat in 2 hours if headache persists or recurs. What changed: Another medication with the same name was removed. Continue taking this medication, and follow the directions you see here.   topiramate 25 MG tablet Commonly known as: TOPAMAX Take 50 mg by mouth daily.   tretinoin 0.025 % cream Commonly known as: RETIN-A tretinoin 0.025 % topical cream   valACYclovir 500 MG tablet Commonly known as: VALTREX Take 2,000 mg by mouth 2 (two) times daily as needed. What changed: Another medication with the same name was removed. Continue taking this medication, and follow the directions you see here.   zolpidem 10 MG tablet Commonly known as: AMBIEN TK 1 T PO IMMEDIATELY BEFORE BEDTIME FOR SLEEP        Discharge Exam: Filed Weights   03/07/23 2358 03/12/23 1431  Weight: 70.3 kg 70.3 kg   General exam: awake, alert, no acute distress HEENT: atraumatic, clear conjunctiva,  anicteric sclera, moist mucus membranes, hearing grossly normal  Respiratory system: on room air, normal respiratory effort. Cardiovascular system: RRR, no pedal edema.   Gastrointestinal system: soft, non-tender, +bowel sounds. Central nervous system: A&O x4. no gross focal neurologic deficits, normal speech Extremities: moves all, no edema, normal tone Skin: dry, intact, normal temperatur Psychiatry: normal mood, congruent affect, judgement and insight appear normal   Condition at discharge: stable  The results of significant diagnostics from this hospitalization (including imaging, microbiology, ancillary and laboratory) are listed below for reference.   Imaging Studies: CT Renal Stone Study  Result Date: 03/08/2023 CLINICAL DATA:  Flank pain. Stone suspected. History of kidney failure. EXAM: CT ABDOMEN AND PELVIS WITHOUT CONTRAST TECHNIQUE: Multidetector CT imaging of the abdomen and pelvis was performed  following the standard protocol without IV contrast. RADIATION DOSE REDUCTION: This exam was performed according to the departmental dose-optimization program which includes automated exposure control, adjustment of the mA and/or kV according to patient size and/or use of iterative reconstruction technique. COMPARISON:  CT with IV contrast 01/03/2019 FINDINGS: Lower chest: No acute abnormality. Hepatobiliary: No focal liver abnormality is seen without contrast. Status post cholecystectomy. No biliary dilatation. Pancreas: Unremarkable without contrast. Spleen: Unremarkable without contrast. Adrenals/Urinary Tract: There is no adrenal mass. There is no focal abnormality of unenhanced renal cortex. There is diffuse bilateral perirenal stranding. This is similar to the prior study, and could simply be chronic change or could indicate pyelonephritis or acute kidney injury. There is no urinary stone or obstruction. The bladder is contracted and not well seen but does not show adjacent inflammatory  change. Stomach/Bowel: Postsurgical change noted along the outer gastric wall suggesting a previous sleeve gastrectomy. The stomach is contracted. Small and large bowel and appendix demonstrate no dilatation or wall thickening. There are left colonic diverticula without evidence of diverticulitis. Vascular/Lymphatic: Aortic atherosclerosis. No enlarged abdominal or pelvic lymph nodes. Reproductive: The uterus is absent. Left ovarian dermoid was previously 2 cm and -64 Hounsfield units, today is 4 cm and -33 Hounsfield units effectively having doubled in size in 4 years. There are no complicating features along the wall. No right adnexal abnormality. Other: There is no free air, free fluid, free hemorrhage or incarcerated hernia. Musculoskeletal: There is spondylosis of the thoracic and lumbar spine, mild facet spurring. No primary pathologic bone lesion is seen. IMPRESSION: 1. Diffuse bilateral perirenal stranding similar to the prior study. This could simply be chronic change or could indicate pyelonephritis or acute kidney injury. 2. No urinary stone or obstruction. 3. Aortic atherosclerosis. 4. Diverticulosis without evidence of diverticulitis. 5. 4 cm left ovarian dermoid, previously 2 cm. Consider gyn consult. Aortic Atherosclerosis (ICD10-I70.0). Electronically Signed   By: Almira Bar M.D.   On: 03/08/2023 04:43    Microbiology: Results for orders placed or performed during the hospital encounter of 03/08/23  Urine Culture     Status: None   Collection Time: 03/08/23 12:09 AM   Specimen: Urine, Random  Result Value Ref Range Status   Specimen Description   Final    URINE, RANDOM Performed at [facility], [address], [place], [place] [address]    Special Requests   Final    NONE Performed at [facility], [address] [place], [place] [address]    Culture   Final    NO GROWTH Performed at [facility], [address] [place], [place] [address]    Report  Status 03/09/2023 FINAL  Final  Respiratory (~20 pathogens) panel by PCR     Status: Abnormal   Collection Time: 03/12/23  4:10 PM   Specimen: Nasopharyngeal Swab; Respiratory  Result Value Ref Range Status   Adenovirus NOT DETECTED NOT DETECTED Final   Coronavirus 229E NOT DETECTED NOT DETECTED Final    Comment: (NOTE) The Coronavirus on the Respiratory Panel, DOES NOT test for the novel  Coronavirus (2019 nCoV)    Coronavirus HKU1 NOT DETECTED NOT DETECTED Final   Coronavirus NL63 NOT DETECTED NOT DETECTED Final   Coronavirus OC43 NOT DETECTED NOT DETECTED Final   Metapneumovirus DETECTED (A) NOT DETECTED Final   Rhinovirus / Enterovirus NOT DETECTED NOT DETECTED Final   Influenza A NOT DETECTED NOT DETECTED Final   Influenza B NOT DETECTED NOT DETECTED Final  Parainfluenza Virus 1 NOT DETECTED NOT DETECTED Final   Parainfluenza Virus 2 NOT DETECTED NOT DETECTED Final   Parainfluenza Virus 3 NOT DETECTED NOT DETECTED Final   Parainfluenza Virus 4 NOT DETECTED NOT DETECTED Final   Respiratory Syncytial Virus NOT DETECTED NOT DETECTED Final   Bordetella pertussis NOT DETECTED NOT DETECTED Final   Bordetella Parapertussis NOT DETECTED NOT DETECTED Final   Chlamydophila pneumoniae NOT DETECTED NOT DETECTED Final   Mycoplasma pneumoniae NOT DETECTED NOT DETECTED Final    Comment: Performed at [facility], [address], [place], [place] [address]  SARS Coronavirus 2 by RT PCR (hospital order, performed in [facility] hospital lab) *cepheid single result test* Anterior Nasal Swab     Status: None   Collection Time: 03/12/23  4:10 PM   Specimen: Anterior Nasal Swab  Result Value Ref Range Status   SARS Coronavirus 2 by RT PCR NEGATIVE NEGATIVE Final    Comment: (NOTE) SARS-CoV-2 target nucleic acids are NOT DETECTED.  The SARS-CoV-2 RNA is generally detectable in upper and lower respiratory specimens during the acute phase of infection. The lowest concentration of SARS-CoV-2  viral copies this assay can detect is 250 copies / mL. A negative result does not preclude SARS-CoV-2 infection and should not be used as the sole basis for treatment or other patient management decisions.  A negative result may occur with improper specimen collection / handling, submission of specimen other than nasopharyngeal swab, presence of viral mutation(s) within the areas targeted by this assay, and inadequate number of viral copies (<250 copies / mL). A negative result must be combined with clinical observations, patient history, and epidemiological information.  Fact Sheet for Patients:   RoadLapTop.co.za  Fact Sheet for Healthcare Providers: http://kim-miller.com/  This test is not yet approved or  cleared by the [place] FDA and has been authorized for detection and/or diagnosis of SARS-CoV-2 by FDA under an Emergency Use Authorization (EUA).  This EUA will remain in effect (meaning this test can be used) for the duration of the COVID-19 declaration under Section 564(b)(1) of the Act, 21 U.S.C. section 360bbb-3(b)(1), unless the authorization is terminated or revoked sooner.  Performed at [facility], [address] [place], [place] [address]     Labs: CBC: Recent Labs  Lab 03/08/23 0009 03/09/23 0552 03/11/23 0840 03/13/23 0404  WBC 7.3 3.2* 3.8* 2.6*  NEUTROABS 4.7  --   --   --   HGB 12.7 11.5* 9.8* 9.3*  HCT 37.0 33.8* 28.4* 27.2*  MCV 95.6 98.3 95.9 97.5  PLT 231 211 165 147*   Basic Metabolic Panel: Recent Labs  Lab 03/09/23 0552 03/10/23 0808 03/11/23 0433 03/12/23 0453 03/13/23 0404  NA 143 142 141 140 141  K 3.2* 4.4 4.0 4.7 3.9  CL 114* 112* 110 113* 116*  CO2 23 24 24 22  21*  GLUCOSE 104* 89 83 84 103*  BUN 12 11 14 14 14   CREATININE 0.89 0.80 0.92 0.83 0.75  CALCIUM 8.6* 8.4* 8.5* 8.4* 8.1*  MG  --  2.0  --   --   --   PHOS 4.0  --   --   --   --    Liver Function  Tests: Recent Labs  Lab 03/08/23 0009  AST 17  ALT 14  ALKPHOS 68  BILITOT 0.4  PROT 6.9  ALBUMIN 4.0   CBG: Recent Labs  Lab 03/08/23 1602 03/08/23 2027  GLUCAP 83 75    Discharge time  spent: greater than 30 minutes.  Signed: Pennie Banter, DO [facility] 03/13/2023

## 2023-03-13 NOTE — Plan of Care (Signed)
  Problem: Education: Goal: Knowledge of General Education information will improve Description: Including pain rating scale, medication(s)/side effects and non-pharmacologic comfort measures Outcome: Progressing   Problem: Clinical Measurements: Goal: Will remain free from infection Outcome: Progressing Goal: Respiratory complications will improve Outcome: Progressing Goal: Cardiovascular complication will be avoided Outcome: Progressing   Problem: Activity: Goal: Risk for activity intolerance will decrease Outcome: Progressing   Problem: Nutrition: Goal: Adequate nutrition will be maintained Outcome: Progressing   Problem: Coping: Goal: Level of anxiety will decrease Outcome: Progressing   Problem: Elimination: Goal: Will not experience complications related to bowel motility Outcome: Progressing Goal: Will not experience complications related to urinary retention Outcome: Progressing   Problem: Safety: Goal: Ability to remain free from injury will improve Outcome: Progressing

## 2023-03-14 LAB — SURGICAL PATHOLOGY

## 2023-03-14 NOTE — Anesthesia Postprocedure Evaluation (Signed)
Anesthesia Post Note  Patient: Patricia Gonzalez  Procedure(s) Performed: ESOPHAGOGASTRODUODENOSCOPY (EGD) WITH PROPOFOL  Patient location during evaluation: PACU Anesthesia Type: General Level of consciousness: awake and alert Pain management: pain level controlled Vital Signs Assessment: post-procedure vital signs reviewed and stable Respiratory status: spontaneous breathing, nonlabored ventilation, respiratory function stable and patient connected to nasal cannula oxygen Cardiovascular status: blood pressure returned to baseline and stable Postop Assessment: no apparent nausea or vomiting Anesthetic complications: no   No notable events documented.   Last Vitals:  Vitals:   03/13/23 0532 03/13/23 0750  BP: 136/87 138/80  Pulse: (!) 58 72  Resp: 16 16  Temp: 36.8 C 36.9 C  SpO2: 97% 98%    Last Pain:  Vitals:   03/13/23 1010  TempSrc:   PainSc: 0-No pain                 Yevette Edwards

## 2023-03-15 ENCOUNTER — Encounter: Payer: Self-pay | Admitting: Internal Medicine

## 2023-04-11 IMAGING — CR DG LUMBAR SPINE COMPLETE 4+V
1 series · 5 of 5 positions shown · non-contrast
Comparison: CT January 04, 2020

CLINICAL DATA: Constant lower back pain x1 day

EXAM:
LUMBAR SPINE - COMPLETE 4+ VIEW

[Series 1: dg lumbar spine complete 4 +v · 0.14mm/px · 5 of 5 slices shown]
[im 1/5]
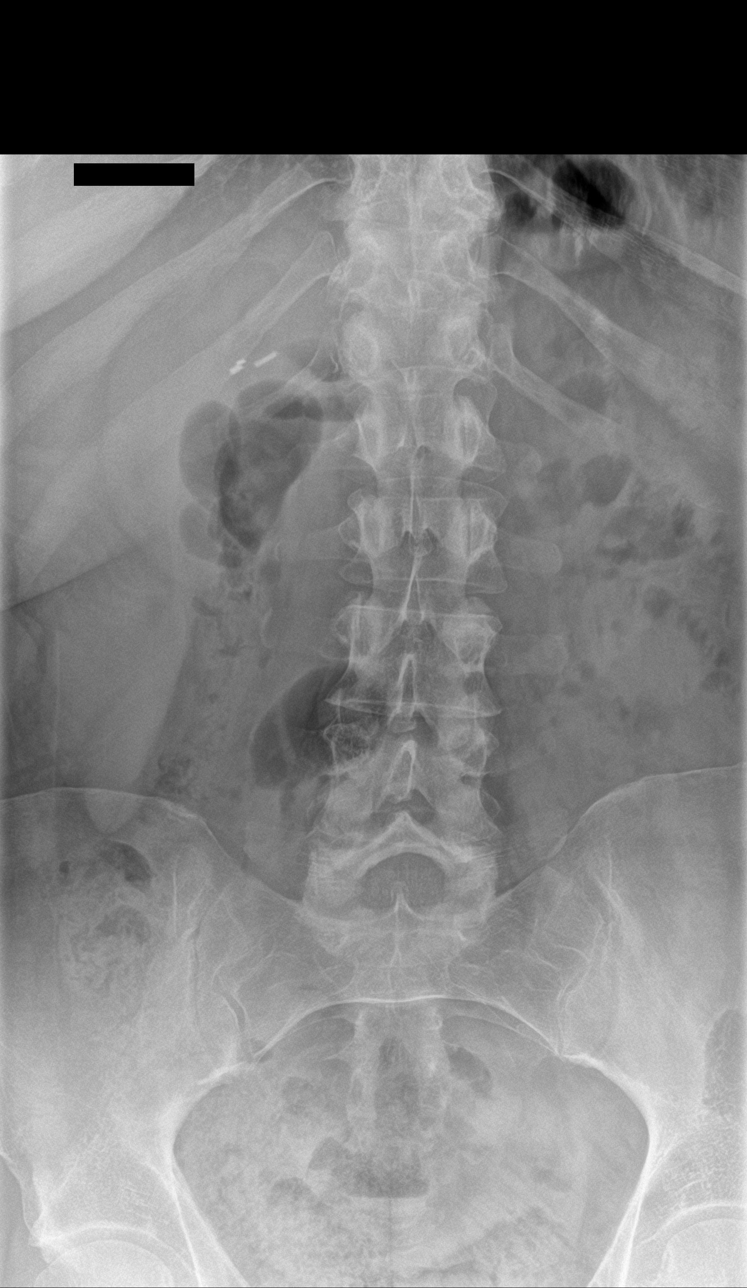
[im 2/5]
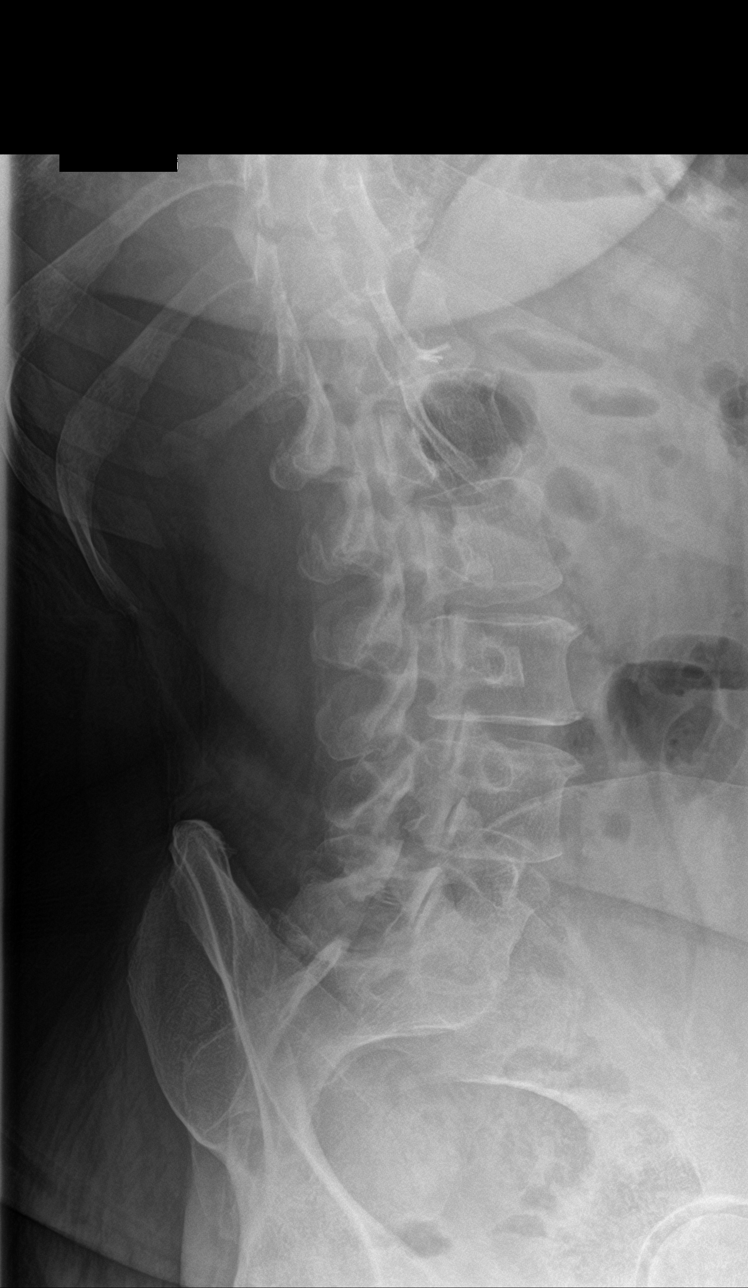
[im 3/5]
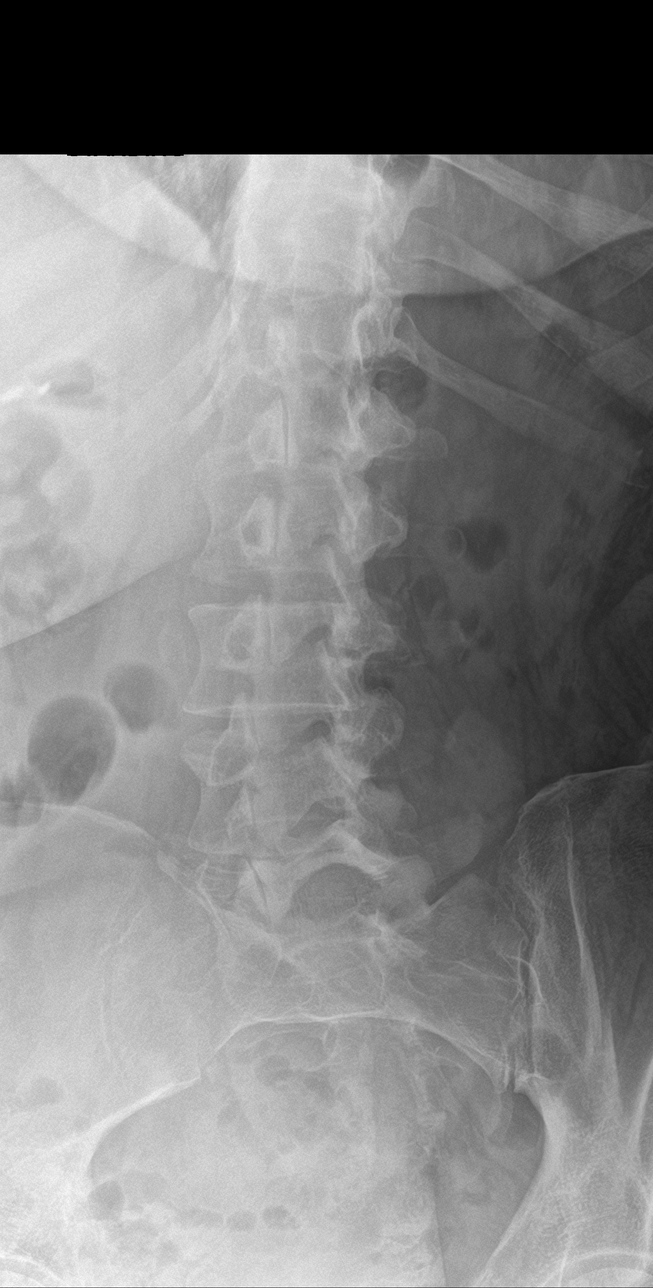
[im 4/5]
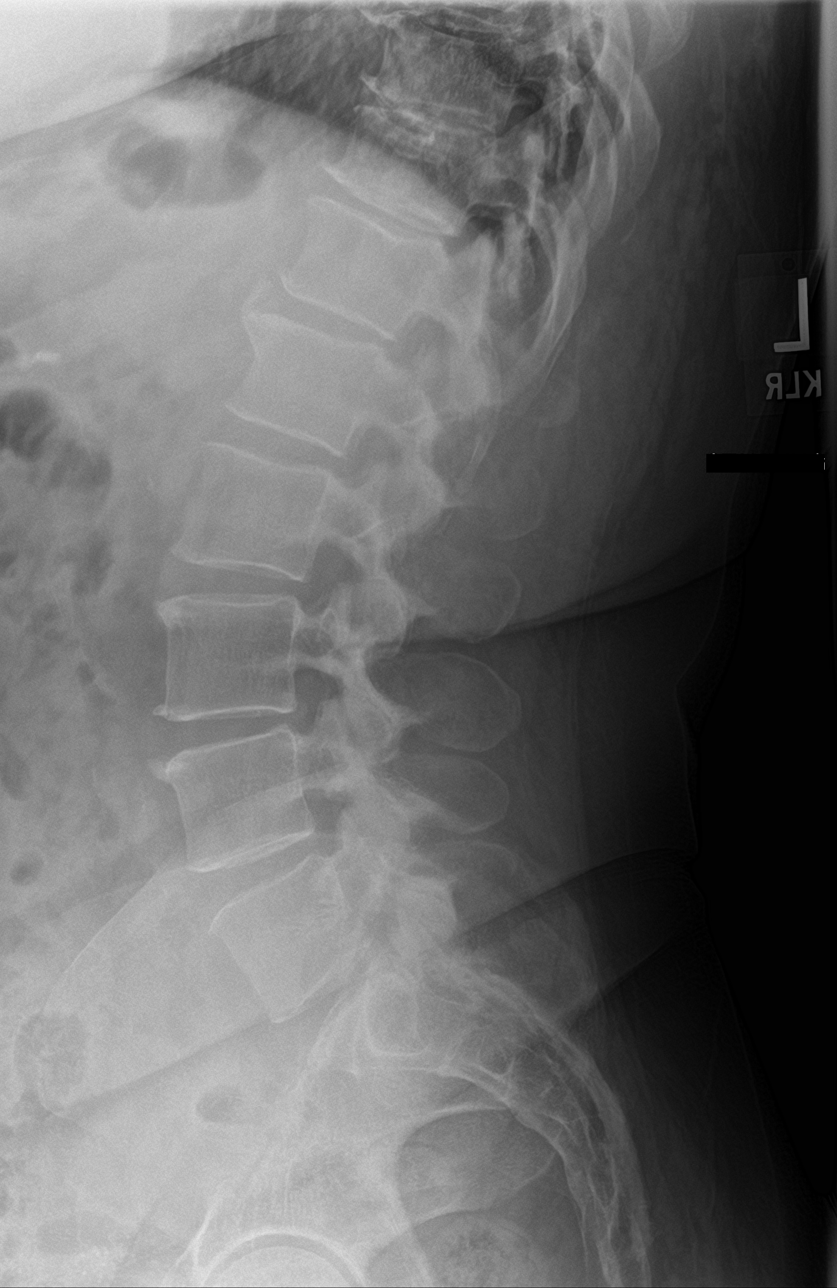
[im 5/5]
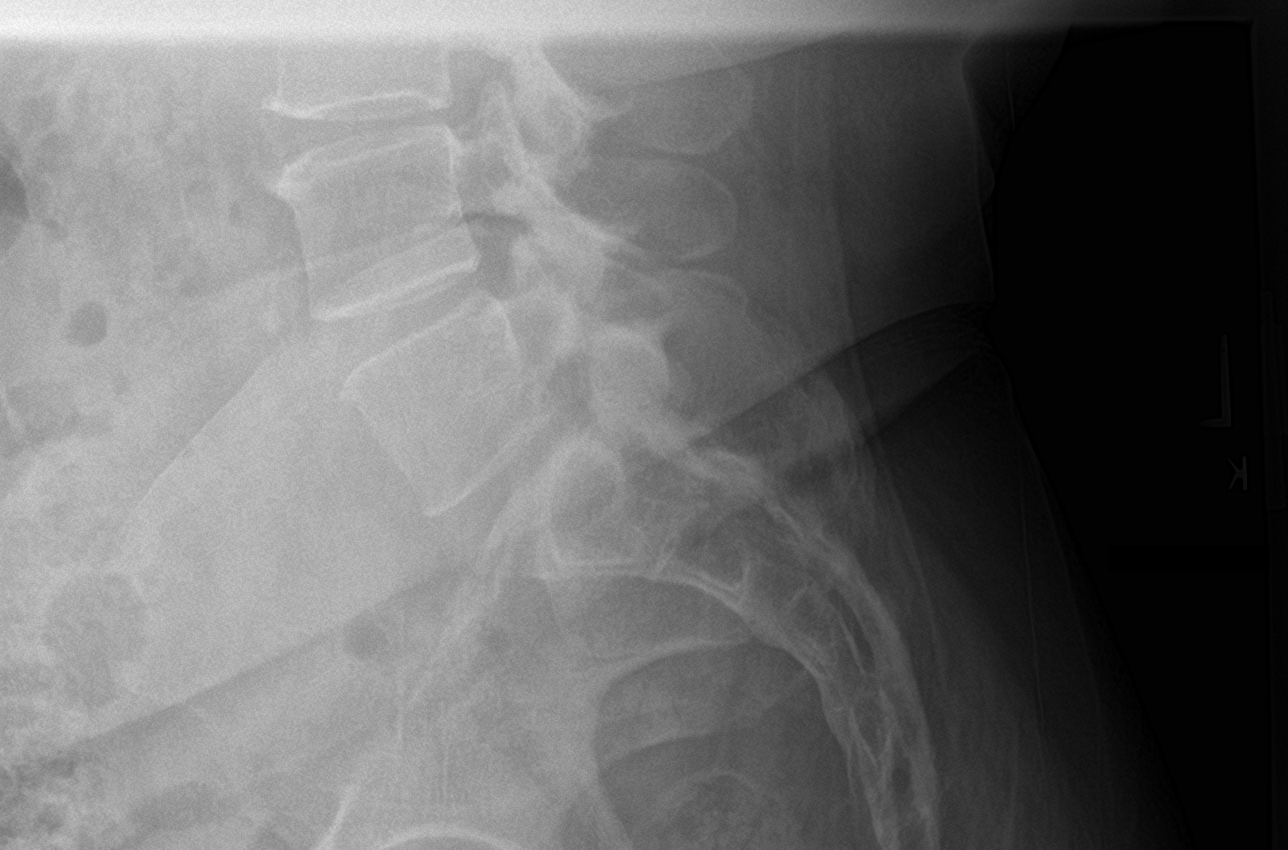

[5 of 5 positions shown; findings below may reference images not displayed]

FINDINGS: There is no evidence of lumbar spine fracture. Alignment is normal.
Mild L3-L4 disc space narrowing. Lower lumbar predominant facet
hypertrophy. Multilevel osteophytosis. Cholecystectomy clips.
IMPRESSION: Mild degenerative changes, greatest at L3-L4. No acute findings.

## 2024-05-18 ENCOUNTER — Other Ambulatory Visit: Payer: Self-pay

## 2024-05-18 ENCOUNTER — Emergency Department
Admission: EM | Admit: 2024-05-18 | Discharge: 2024-05-19 | Disposition: A | Discharge status: Home / Self Care | Payer: Self-pay | Attending: Emergency Medicine | Admitting: Emergency Medicine

## 2024-05-18 DIAGNOSIS — S0990XA Unspecified injury of head, initial encounter: Secondary | ICD-10-CM

## 2024-05-18 DIAGNOSIS — S50812A Abrasion of left forearm, initial encounter: Secondary | ICD-10-CM | POA: Diagnosis not present

## 2024-05-18 DIAGNOSIS — T71194A Asphyxiation due to mechanical threat to breathing due to other causes, undetermined, initial encounter: Secondary | ICD-10-CM

## 2024-05-18 DIAGNOSIS — I10 Essential (primary) hypertension: Secondary | ICD-10-CM | POA: Diagnosis not present

## 2024-05-18 DIAGNOSIS — E119 Type 2 diabetes mellitus without complications: Secondary | ICD-10-CM | POA: Insufficient documentation

## 2024-05-18 DIAGNOSIS — T148XXA Other injury of unspecified body region, initial encounter: Secondary | ICD-10-CM | POA: Insufficient documentation

## 2024-05-18 DIAGNOSIS — R0789 Other chest pain: Secondary | ICD-10-CM | POA: Diagnosis not present

## 2024-05-18 DIAGNOSIS — S0001XA Abrasion of scalp, initial encounter: Secondary | ICD-10-CM | POA: Diagnosis not present

## 2024-05-18 DIAGNOSIS — T07XXXA Unspecified multiple injuries, initial encounter: Secondary | ICD-10-CM
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
## 2024-05-19 ENCOUNTER — Emergency Department: Payer: Self-pay

## 2024-05-19 ENCOUNTER — Emergency Department

## 2024-05-19 LAB — CBC WITH DIFFERENTIAL/PLATELET
Abs Immature Granulocytes: 0.06 10*3/uL (ref 0.00–0.07)
Basophils Absolute: 0.1 10*3/uL (ref 0.0–0.1)
Basophils Relative: 1 %
Eosinophils Absolute: 0.1 10*3/uL (ref 0.0–0.5)
Eosinophils Relative: 1 %
HCT: 36.9 % (ref 36.0–46.0)
Hemoglobin: 13.1 g/dL (ref 12.0–15.0)
Immature Granulocytes: 1 %
Lymphocytes Relative: 31 %
Lymphs Abs: 3.7 10*3/uL (ref 0.7–4.0)
MCH: 34.3 pg — ABNORMAL HIGH (ref 26.0–34.0)
MCHC: 35.5 g/dL (ref 30.0–36.0)
MCV: 96.6 fL (ref 80.0–100.0)
Monocytes Absolute: 0.7 10*3/uL (ref 0.1–1.0)
Monocytes Relative: 5 %
Neutro Abs: 7.5 10*3/uL (ref 1.7–7.7)
Neutrophils Relative %: 61 %
Platelets: 300 10*3/uL (ref 150–400)
RBC: 3.82 MIL/uL — ABNORMAL LOW (ref 3.87–5.11)
RDW: 13.2 % (ref 11.5–15.5)
WBC: 12.1 10*3/uL — ABNORMAL HIGH (ref 4.0–10.5)
nRBC: 0 % (ref 0.0–0.2)

## 2024-05-19 LAB — COMPREHENSIVE METABOLIC PANEL WITH GFR
ALT: 17 U/L (ref 0–44)
AST: 19 U/L (ref 15–41)
Albumin: 4 g/dL (ref 3.5–5.0)
Alkaline Phosphatase: 58 U/L (ref 38–126)
Anion gap: 10 (ref 5–15)
BUN: 20 mg/dL (ref 6–20)
CO2: 28 mmol/L (ref 22–32)
Calcium: 9 mg/dL (ref 8.9–10.3)
Chloride: 104 mmol/L (ref 98–111)
Creatinine, Ser: 0.67 mg/dL (ref 0.44–1.00)
GFR, Estimated: >60 mL/min (ref >60)
Glucose, Bld: 89 mg/dL (ref 70–99)
Potassium: 2.6 mmol/L — CL (ref 3.5–5.1)
Sodium: 142 mmol/L (ref 135–145)
Total Bilirubin: 0.5 mg/dL (ref 0.0–1.2)
Total Protein: 6.8 g/dL (ref 6.5–8.1)

## 2024-05-19 MED ORDER — ONDANSETRON 8 MG PO TBDP: 8.0000 mg | ORAL_TABLET | Freq: Three times a day (TID) | ORAL | 0 refills | Status: AC | PRN

## 2024-05-19 MED ORDER — FENTANYL CITRATE PF 50 MCG/ML IJ SOSY
50.0000 ug | PREFILLED_SYRINGE | Freq: Once | INTRAMUSCULAR | Status: AC
  Administered 2024-05-19: 50 ug via INTRAVENOUS
  Filled 2024-05-19: qty 1

## 2024-05-19 MED ORDER — OXYCODONE-ACETAMINOPHEN 5-325 MG PO TABS
1.0000 | ORAL_TABLET | Freq: Once | ORAL | Status: AC
  Administered 2024-05-19: 1 via ORAL
  Filled 2024-05-19: qty 1

## 2024-05-19 MED ORDER — ACETAMINOPHEN 325 MG PO TABS
650.0000 mg | ORAL_TABLET | Freq: Once | ORAL | Status: AC
  Administered 2024-05-19: 650 mg via ORAL
  Filled 2024-05-19: qty 2

## 2024-05-19 MED ORDER — ONDANSETRON HCL 4 MG/2ML IJ SOLN
4.0000 mg | Freq: Once | INTRAMUSCULAR | Status: AC
  Administered 2024-05-19: 4 mg via INTRAVENOUS
  Filled 2024-05-19: qty 2

## 2024-05-19 MED ORDER — IOHEXOL 350 MG/ML SOLN
75.0000 mL | Freq: Once | INTRAVENOUS | Status: AC | PRN
  Administered 2024-05-19: 75 mL via INTRAVENOUS

## 2024-05-19 MED ORDER — POTASSIUM CHLORIDE 20 MEQ PO PACK: 40.0000 meq | PACK | Freq: Once | ORAL | Status: DC

## 2024-05-19 MED ORDER — POTASSIUM CHLORIDE CRYS ER 20 MEQ PO TBCR
40.0000 meq | EXTENDED_RELEASE_TABLET | Freq: Once | ORAL | Status: AC
  Administered 2024-05-19: 40 meq via ORAL
  Filled 2024-05-19: qty 2

## 2024-05-19 MED ORDER — OXYCODONE-ACETAMINOPHEN 5-325 MG PO TABS: 1.0000 | ORAL_TABLET | Freq: Four times a day (QID) | ORAL | 0 refills | Status: AC | PRN

## 2024-07-25 ENCOUNTER — Other Ambulatory Visit: Payer: Self-pay | Admitting: Family Medicine

## 2024-07-25 DIAGNOSIS — Z1231 Encounter for screening mammogram for malignant neoplasm of breast: Secondary | ICD-10-CM
